# Patient Record
Sex: Female | Born: 1995 | Hispanic: Yes | Smoking: Never smoker
Health system: Southern US, Community
[De-identification: ages and names within clinical notes are randomized; demographics above are authoritative.]

## PROBLEM LIST (undated history)

## (undated) DIAGNOSIS — E669 Obesity, unspecified: Secondary | ICD-10-CM

## (undated) DIAGNOSIS — J45909 Unspecified asthma, uncomplicated: Secondary | ICD-10-CM

## (undated) DIAGNOSIS — L309 Dermatitis, unspecified: Secondary | ICD-10-CM

## (undated) HISTORY — PX: ADENOIDECTOMY: SUR15

## (undated) HISTORY — DX: Dermatitis, unspecified: L30.9

## (undated) HISTORY — PX: TONSILLECTOMY: SUR1361

## (undated) HISTORY — DX: Unspecified asthma, uncomplicated: J45.909

## (undated) HISTORY — DX: Obesity, unspecified: E66.9

## (undated) HISTORY — PX: SINOSCOPY: SHX187

---
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
## 2018-07-10 ENCOUNTER — Inpatient Hospital Stay (HOSPITAL_COMMUNITY)
Admission: AD | Admit: 2018-07-10 | Discharge: 2018-07-10 | Disposition: A | Discharge status: Home / Self Care | Payer: Self-pay | Attending: Obstetrics and Gynecology | Admitting: Obstetrics and Gynecology

## 2018-07-10 ENCOUNTER — Telehealth: Payer: Self-pay | Admitting: Family Medicine

## 2018-07-10 ENCOUNTER — Other Ambulatory Visit: Payer: Self-pay

## 2018-07-10 DIAGNOSIS — Z3202 Encounter for pregnancy test, result negative: Secondary | ICD-10-CM

## 2018-07-10 DIAGNOSIS — N925 Other specified irregular menstruation: Secondary | ICD-10-CM | POA: Insufficient documentation

## 2018-07-10 LAB — POCT PREGNANCY, URINE: Preg Test, Ur: NEGATIVE

## 2018-07-10 NOTE — MAU Note (Signed)
Pt can't leave sample, gave her a glass of water and asked her to wait in the waiting room until she can.

## 2018-07-10 NOTE — Telephone Encounter (Signed)
Patient called in stating that she needs to make an appointment because she thinks she had a miscarriage. Patient was asked if she is currently bleeding and she stated that she is not but she had a clot. Spoke with Diamond Farley about this concern and she advised patient to go to MAU. Patient advised to visit MAU (address was given). Patient verbalized understanding.

## 2018-07-10 NOTE — MAU Note (Signed)
Pt said she may be having a miscarriage. Said she didn't take a pregnancy test and was 2 days late. Was supposed to start 5/6, but started 5/9. Started heavier than usual.

## 2018-07-10 NOTE — MAU Provider Note (Signed)
First Provider Initiated Contact with Patient 07/10/18 1749      S Ms. Diamond Farley is a 23 y.o.  G0P) female who presents to MAU today with complaint of concern of miscarriage. Reports that her menstrual period started about 1-2 days late and that she passed a large clot at the start of her period with some white tinge to it. She is concerned this is a sign of a SAB. No longer bleeding. Menstrual periods typically regular. No longer on hormonal contraception, previously used the patch and OCPs. Denies abdominal pain, cramping, nausea/vomiting, fevers, vaginal discharge.   O BP 135/76 (BP Location: Right Arm)   Pulse 97   Temp 98.8 F (37.1 C) (Oral)   Resp 16   Ht 4' 11.5" (1.511 m)   Wt 78 kg   LMP 07/07/2018   SpO2 97%   BMI 34.16 kg/m  Physical Exam  Constitutional: She is oriented to person, place, and time. She appears well-developed and well-nourished. No distress.  HENT:  Head: Normocephalic and atraumatic.  Eyes: Conjunctivae and EOM are normal.  Neck: Normal range of motion. Neck supple.  Cardiovascular: Normal rate and regular rhythm.  Respiratory: Effort normal and breath sounds normal.  GI: She exhibits no distension. There is no abdominal tenderness. There is no rebound and no guarding.  Musculoskeletal: Normal range of motion.  Neurological: She is alert and oriented to person, place, and time.  Skin: Skin is warm and dry.  Psychiatric: She has a normal mood and affect. Her behavior is normal.    A Non pregnant female. Upreg negative.  Medical screening exam complete   P Discharge from MAU in stable condition Patient given the option of transfer to [facility] for further evaluation or seek care in outpatient facility of choice List of options for follow-up given  Warning signs for worsening condition that would warrant emergency follow-up discussed Patient may return to MAU as needed for pregnancy related complaints  Arvilla Market,  DO 07/10/2018 5:55 PM

## 2018-08-20 ENCOUNTER — Other Ambulatory Visit: Payer: Self-pay

## 2018-08-20 ENCOUNTER — Encounter: Payer: Medicaid Other | Admitting: Certified Nurse Midwife

## 2018-09-14 ENCOUNTER — Other Ambulatory Visit (HOSPITAL_COMMUNITY)
Admission: RE | Admit: 2018-09-14 | Discharge: 2018-09-14 | Disposition: A | Discharge status: Home / Self Care | Payer: Medicaid Other | Source: Ambulatory Visit | Attending: Medical | Admitting: Medical

## 2018-09-14 ENCOUNTER — Ambulatory Visit (INDEPENDENT_AMBULATORY_CARE_PROVIDER_SITE_OTHER): Payer: Medicaid Other | Admitting: Medical

## 2018-09-14 ENCOUNTER — Other Ambulatory Visit: Payer: Self-pay

## 2018-09-14 ENCOUNTER — Encounter: Payer: Self-pay | Admitting: Medical

## 2018-09-14 VITALS — BP 139/87 | HR 100 | Ht 60.0 in | Wt 168.8 lb

## 2018-09-14 DIAGNOSIS — N760 Acute vaginitis: Secondary | ICD-10-CM

## 2018-09-14 DIAGNOSIS — R7303 Prediabetes: Secondary | ICD-10-CM

## 2018-09-14 DIAGNOSIS — B379 Candidiasis, unspecified: Secondary | ICD-10-CM

## 2018-09-14 DIAGNOSIS — Z01419 Encounter for gynecological examination (general) (routine) without abnormal findings: Secondary | ICD-10-CM | POA: Diagnosis present

## 2018-09-14 DIAGNOSIS — J4520 Mild intermittent asthma, uncomplicated: Secondary | ICD-10-CM

## 2018-09-14 DIAGNOSIS — Z3202 Encounter for pregnancy test, result negative: Secondary | ICD-10-CM

## 2018-09-14 DIAGNOSIS — B49 Unspecified mycosis: Secondary | ICD-10-CM

## 2018-09-14 DIAGNOSIS — R030 Elevated blood-pressure reading, without diagnosis of hypertension: Secondary | ICD-10-CM | POA: Insufficient documentation

## 2018-09-14 DIAGNOSIS — Z0000 Encounter for general adult medical examination without abnormal findings: Secondary | ICD-10-CM

## 2018-09-14 DIAGNOSIS — Z3009 Encounter for other general counseling and advice on contraception: Secondary | ICD-10-CM

## 2018-09-14 DIAGNOSIS — J45909 Unspecified asthma, uncomplicated: Secondary | ICD-10-CM | POA: Insufficient documentation

## 2018-09-14 DIAGNOSIS — B9689 Other specified bacterial agents as the cause of diseases classified elsewhere: Secondary | ICD-10-CM

## 2018-09-14 LAB — POCT URINE PREGNANCY: Preg Test, Ur: NEGATIVE

## 2018-09-14 MED ORDER — NORELGESTROMIN-ETH ESTRADIOL 150-35 MCG/24HR TD PTWK: 1.0000 | MEDICATED_PATCH | TRANSDERMAL | 12 refills | Status: DC

## 2018-09-14 NOTE — Progress Notes (Signed)
Subjective:    Diamond Farley is a 23 y.o. female who presents for an annual exam. The patient would also like to discuss birth control options. The patient is sexually active. GYN screening history: no prior history of gyn screening tests. The patient wears seatbelts: yes. The patient participates in regular exercise: no. Has the patient ever been transfused or tattooed?: no. The patient reports that there is not domestic violence in her life.   Menstrual History: OB History    Gravida  0   Para  0   Term  0   Preterm  0   AB  0   Living  0     SAB  0   TAB  0   Ectopic  0   Multiple  0   Live Births  0           Menarche age: unsure, between 54-32 years old  Patient's last menstrual period was 09/08/2018.    The following portions of the patient's history were reviewed and updated as appropriate: allergies, current medications, past family history, past medical history, past social history, past surgical history and problem list.  Review of Systems Pertinent items are noted in HPI.    Objective:   Physical Exam  Nursing note and vitals reviewed. Constitutional: She is oriented to person, place, and time. She appears well-developed and well-nourished. No distress.  HENT:  Head: Normocephalic and atraumatic.  Eyes: EOM are normal.  Neck: Normal range of motion. Neck supple. No thyromegaly present.  Cardiovascular: Normal rate, regular rhythm and normal heart sounds.  No murmur heard. Respiratory: Effort normal and breath sounds normal. No respiratory distress. She has no wheezes.  GI: Soft. Bowel sounds are normal. She exhibits no distension and no mass. There is no abdominal tenderness. There is no rebound and no guarding.  Genitourinary: Uterus is not enlarged and not tender. Cervix exhibits no motion tenderness, no discharge and no friability. Right adnexum displays no mass and no tenderness. Left adnexum displays no mass and no tenderness.    No vaginal  discharge or bleeding.  No bleeding in the vagina.  Musculoskeletal:        General: No edema.  Neurological: She is alert and oriented to person, place, and time.  Skin: Skin is warm and dry. No erythema.  Psychiatric: She has a normal mood and affect.      Results for orders placed or performed in visit on 09/14/18 (from the past 24 hour(s))  POCT urine pregnancy     Status: Normal   Collection Time: 09/14/18  8:49 AM  Result Value Ref Range   Preg Test, Ur Negative Negative    Assessment:    Healthy female exam.   Birth control counseling    Plan:     Await pap smear results.   Patient is requesting STD testing today. Patient has [facility] and understands that full STD testing may result in a bill to the patient.   Patient will be contacted with any abnormal results  Patient encouraged to sign up for MyChart following today's visit in order to receive notification of all results  Rx Ortho Evra patch sent to patient's pharmacy of choice Patient to return to [facility] in 1 year for annual exam or sooner PRN   Danielle Rankin 09/14/2018 8:36 AM

## 2018-09-14 NOTE — Patient Instructions (Signed)

## 2018-09-14 NOTE — Progress Notes (Signed)
Pt presents for New Patient AEX. Pt would like to discuss birth control options today. 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 Pt has no other concerns.

## 2018-09-15 LAB — HIV ANTIBODY (ROUTINE TESTING W REFLEX): HIV Screen 4th Generation wRfx: NONREACTIVE

## 2018-09-15 LAB — HEPATITIS C ANTIBODY: Hep C Virus Ab: <0.1 s/co ratio (ref 0.0–0.9)

## 2018-09-15 LAB — HEPATITIS B SURFACE ANTIGEN: Hepatitis B Surface Ag: NEGATIVE

## 2018-09-15 LAB — RPR: RPR Ser Ql: NONREACTIVE

## 2018-09-17 LAB — CYTOLOGY - PAP
Adequacy: ABSENT
Diagnosis: NEGATIVE

## 2018-09-17 LAB — CERVICOVAGINAL ANCILLARY ONLY
Bacterial vaginitis: POSITIVE — AB
Candida vaginitis: POSITIVE — AB
Chlamydia: NEGATIVE
Neisseria Gonorrhea: NEGATIVE
Trichomonas: NEGATIVE

## 2018-09-18 MED ORDER — METRONIDAZOLE 0.75 % VA GEL: 1.0000 | Freq: Two times a day (BID) | VAGINAL | 0 refills | Status: DC

## 2018-09-18 MED ORDER — FLUCONAZOLE 150 MG PO TABS: 150.0000 mg | ORAL_TABLET | Freq: Every day | ORAL | 0 refills | Status: DC

## 2018-09-18 NOTE — Addendum Note (Signed)
Addended by: Luvenia Redden on: 09/18/2018 08:28 AM   Modules accepted: Orders

## 2018-10-31 ENCOUNTER — Other Ambulatory Visit: Payer: Self-pay

## 2018-10-31 ENCOUNTER — Telehealth: Payer: Self-pay

## 2018-10-31 ENCOUNTER — Other Ambulatory Visit: Payer: Self-pay | Admitting: Medical

## 2018-10-31 DIAGNOSIS — Z3009 Encounter for other general counseling and advice on contraception: Secondary | ICD-10-CM

## 2018-10-31 MED ORDER — NORGESTIMATE-ETH ESTRADIOL 0.25-35 MG-MCG PO TABS: 1.0000 | ORAL_TABLET | Freq: Every day | ORAL | 11 refills | Status: DC

## 2018-10-31 NOTE — Telephone Encounter (Signed)
For some reason the pharmacy keep on  disappearing when I click on it. [facility] is the one on file. Just click select pharmacy.

## 2018-10-31 NOTE — Telephone Encounter (Signed)
I will send Rx for Sprintec but there is no pharmacy in the chart.

## 2018-10-31 NOTE — Telephone Encounter (Signed)
Patient reports the birth control patch ortho evra is making her have an itchy rash. She has move the path into the places on her body however since has not help. She would like to know if [facility]

## 2018-10-31 NOTE — Telephone Encounter (Signed)
She is already taking Zyrtec with no relief. She would like to go back to birth control pills. Pharmacy is updated.

## 2018-10-31 NOTE — Telephone Encounter (Signed)
[facility]. I sent Rx for OCPs to that pharmacy.

## 2018-10-31 NOTE — Telephone Encounter (Signed)
There isn't another type of patch. She may be allergic to the adhesive anyway, so no patches would work for her. She can either try Zyrtec during the day and Benadryl at night for a few days and see if her body gets used to it or she will have to choose another form of birth control. Just let me know what she decides.

## 2019-03-15 ENCOUNTER — Other Ambulatory Visit: Payer: Self-pay

## 2019-03-15 ENCOUNTER — Ambulatory Visit (HOSPITAL_COMMUNITY)
Admission: EM | Admit: 2019-03-15 | Discharge: 2019-03-15 | Disposition: A | Discharge status: Home / Self Care | Payer: Medicaid Other | Attending: Emergency Medicine | Admitting: Emergency Medicine

## 2019-03-15 ENCOUNTER — Encounter (HOSPITAL_COMMUNITY): Payer: Self-pay | Admitting: Emergency Medicine

## 2019-03-15 DIAGNOSIS — J45909 Unspecified asthma, uncomplicated: Secondary | ICD-10-CM | POA: Insufficient documentation

## 2019-03-15 DIAGNOSIS — R030 Elevated blood-pressure reading, without diagnosis of hypertension: Secondary | ICD-10-CM | POA: Insufficient documentation

## 2019-03-15 DIAGNOSIS — N939 Abnormal uterine and vaginal bleeding, unspecified: Secondary | ICD-10-CM

## 2019-03-15 DIAGNOSIS — R7303 Prediabetes: Secondary | ICD-10-CM | POA: Diagnosis not present

## 2019-03-15 DIAGNOSIS — Z79899 Other long term (current) drug therapy: Secondary | ICD-10-CM | POA: Insufficient documentation

## 2019-03-15 LAB — POCT PREGNANCY, URINE: Preg Test, Ur: NEGATIVE

## 2019-03-15 LAB — POC URINE PREG, ED: Preg Test, Ur: NEGATIVE

## 2019-03-15 LAB — POCT URINALYSIS DIP (DEVICE)
Bilirubin Urine: NEGATIVE
Glucose, UA: NEGATIVE mg/dL
Ketones, ur: NEGATIVE mg/dL
Leukocytes,Ua: NEGATIVE
Nitrite: NEGATIVE
Protein, ur: NEGATIVE mg/dL
Specific Gravity, Urine: 1.02 (ref 1.005–1.030)
Urobilinogen, UA: 0.2 mg/dL (ref 0.0–1.0)
pH: 8 (ref 5.0–8.0)

## 2019-03-15 MED ORDER — NAPROXEN 500 MG PO TABS: 500.0000 mg | ORAL_TABLET | Freq: Two times a day (BID) | ORAL | 0 refills | Status: DC

## 2019-03-15 NOTE — ED Triage Notes (Signed)
Pt states she had her period on Dec 26th, and she started having vaginal bleeding again on the 9th up until today. C/o cramping.

## 2019-03-15 NOTE — ED Provider Notes (Signed)
[name]    CSN: [id number] Arrival date & time: 03/15/19  1553      History   Chief Complaint Chief Complaint  Patient presents with  . Vaginal Bleeding    HPI Diamond Farley is a 24 y.o. female.   Diamond Farley presents with complaints of vaginal bleeding which started 6 days ago, Farley though her LMP was 12/26. She typically has one monthly regular period. Her last period was normal for her. Flow has varied, decreased last evening but then today restarted. Not heavy, hasn't had to change out pad today (has only for sanitary purposes but not due to filling.) some cramping. No back pain. No urinary symptoms. Denies  Any previous similar. She is on oral birth control and takes regular, denies any missed pills. Sexually active with 1 partner and doesn't always use condoms. No vaginal discharge itching or irritation. Has appointment with gynecology next month. Denies any specific activity changes or lifestyle changes but has been working early shifts regularly at work recently. History  Of ovarian cysts.    ROS per HPI, negative if not otherwise mentioned.      Past Medical History:  Diagnosis Date  . Asthma     Patient Active Problem List   Diagnosis Date Noted  . Asthma 09/14/2018  . Borderline hypertension 09/14/2018  . Pre-diabetes 09/14/2018    Past Surgical History:  Procedure Laterality Date  . TONSILLECTOMY      OB History    Gravida  0   Para  0   Term  0   Preterm  0   AB  0   Living  0     SAB  0   TAB  0   Ectopic  0   Multiple  0   Live Births  0            Home Medications    Prior to Admission medications   Medication Sig Start Date End Date Taking? Authorizing Provider  albuterol (PROVENTIL HFA;VENTOLIN HFA) 108 (90 Base) MCG/ACT inhaler Inhale 2 puffs into the lungs every 6 (six) hours as needed for wheezing or shortness of breath.    [provider]  beclomethasone (QVAR) 80 MCG/ACT inhaler  Inhale 2 puffs into the lungs 2 (two) times daily.    [provider]  fluconazole (DIFLUCAN) 150 MG tablet Take 1 tablet (150 mg total) by mouth daily. 09/18/18   Luvenia Redden, PA-C  fluticasone (FLONASE) 50 MCG/ACT nasal spray Place 2 sprays into both nostrils daily.    [provider]  metroNIDAZOLE (METROGEL VAGINAL) 0.75 % vaginal gel Place 1 Applicatorful vaginally 2 (two) times daily. 09/18/18   Luvenia Redden, PA-C  naproxen (NAPROSYN) 500 MG tablet Take 1 tablet (500 mg total) by mouth 2 (two) times daily. 03/15/19   Zigmund Gottron, NP  norelgestromin-ethinyl estradiol (ORTHO EVRA) 150-35 MCG/24HR transdermal patch Place 1 patch onto the skin once a week. 09/14/18   Luvenia Redden, PA-C  norgestimate-ethinyl estradiol (ORTHO-CYCLEN) 0.25-35 MG-MCG tablet Take 1 tablet by mouth daily. 10/31/18   Luvenia Redden, PA-C    Family History Family History  Problem Relation Age of Onset  . Diabetes Mother   . Diabetes Maternal Grandmother   . Hypertension Maternal Grandfather   . Diabetes Paternal Grandmother     Social History Social History   Tobacco Use  . Smoking status: Never Smoker  . Smokeless tobacco: Never Used  Substance Use Topics  . Alcohol  use: Not Currently    Alcohol/week: 6.0 standard drinks    Types: 3 Cans of beer, 3 Shots of liquor per week  . Drug use: Yes    Types: Marijuana    Comment: occ     Allergies   Patient has no known allergies.   Review of Systems Review of Systems   Physical Exam Triage Vital Signs ED Triage Vitals  Enc Vitals Group     BP 03/15/19 1641 131/74     Pulse Rate 03/15/19 1641 72     Resp 03/15/19 1641 16     Temp 03/15/19 1641 98.8 F (37.1 C)     Temp src --      SpO2 03/15/19 1641 100 %     Weight --      Height --      Head Circumference --      Peak Flow --      Pain Score 03/15/19 1642 2     Pain Loc --      Pain Edu? --      Excl. in GC? --    No data found.  Updated Vital Signs BP  131/74   Pulse 72   Temp 98.8 F (37.1 C)   Resp 16   LMP 02/23/2019   SpO2 100%   Visual Acuity Right Eye Distance:   Left Eye Distance:   Bilateral Distance:    Right Eye Near:   Left Eye Near:    Bilateral Near:     Physical Exam Constitutional:      General: She is not in acute distress.    Appearance: She is well-developed.  Cardiovascular:     Rate and Rhythm: Normal rate.  Pulmonary:     Effort: Pulmonary effort is normal.  Genitourinary:    Cervix: Cervical bleeding present.     Uterus: Normal.      Adnexa: Right adnexa normal.       Left: Tenderness present. No mass.       Comments: Small amount of vaginal bleeding noted, mild tenderness to left adnexa without palpable abnormality.  Skin:    General: Skin is warm and dry.  Neurological:     Mental Status: She is alert and oriented to person, place, and time.      UC Treatments / Results  Labs (all labs ordered are listed, but only abnormal results are displayed) Labs Reviewed  POCT URINALYSIS DIP (DEVICE) - Abnormal; Notable for the following components:      Result Value   Hgb urine dipstick LARGE (*)    All other components within normal limits  POC URINE PREG, ED  POCT PREGNANCY, URINE  CERVICOVAGINAL ANCILLARY ONLY    EKG   Radiology No results found.  Procedures Procedures (including critical care time)  Medications Ordered in UC Medications - No data to display  Initial Impression / Assessment and Plan / UC Course  I have reviewed the triage vital signs and the nursing notes.  Pertinent labs & imaging results that were available during my care of the patient were reviewed by me and considered in my medical decision making (see chart for details).     Negative pregnancy here today, vitals stable, has been regularly taking oral birth control, lower suspician for ectopic. Mild left adnexal tenderness on exam, however. Naproxen recommended with strict return precautions. Continue with  follow up with gyne for persistent symptoms. Patient verbalized understanding and agreeable to plan.   Final Clinical Impressions(s) / UC  Diagnoses   Final diagnoses:  Abnormal uterine bleeding     Discharge Instructions     Continue taking your birth control.  May take naproxen, twice a day, take with food. May stop taking if bleeding subsides.  Will notify you of any positive findings from your vaginal and if any changes to treatment are needed.   Please continue to follow up with your gynecologist if symptoms persist.  If any worsening of symptoms please return or go to the ER- heavy bleeding, increased pain, weakness, dizziness, lightheadedness.    ED Prescriptions    Medication Sig Dispense Auth. Provider   naproxen (NAPROSYN) 500 MG tablet Take 1 tablet (500 mg total) by mouth 2 (two) times daily. 30 tablet Georgetta Haber, NP     PDMP not reviewed this encounter.   Georgetta Haber, NP 03/15/19 [phone]

## 2019-03-15 NOTE — Discharge Instructions (Addendum)
Continue taking your birth control.  May take naproxen, twice a day, take with food. May stop taking if bleeding subsides.  Will notify you of any positive findings from your vaginal and if any changes to treatment are needed.   Please continue to follow up with your gynecologist if symptoms persist.  If any worsening of symptoms please return or go to the ER- heavy bleeding, increased pain, weakness, dizziness, lightheadedness.

## 2019-03-20 LAB — CERVICOVAGINAL ANCILLARY ONLY
Bacterial vaginitis: POSITIVE — AB
Candida vaginitis: POSITIVE — AB
Chlamydia: POSITIVE — AB
Neisseria Gonorrhea: NEGATIVE
Trichomonas: NEGATIVE

## 2019-03-21 ENCOUNTER — Telehealth (HOSPITAL_COMMUNITY): Payer: Self-pay | Admitting: Emergency Medicine

## 2019-03-21 MED ORDER — METRONIDAZOLE 500 MG PO TABS: 500.0000 mg | ORAL_TABLET | Freq: Two times a day (BID) | ORAL | 0 refills | Status: AC

## 2019-03-21 MED ORDER — FLUCONAZOLE 150 MG PO TABS: 150.0000 mg | ORAL_TABLET | Freq: Once | ORAL | 0 refills | Status: AC

## 2019-03-21 MED ORDER — AZITHROMYCIN 250 MG PO TABS: 1000.0000 mg | ORAL_TABLET | Freq: Once | ORAL | 0 refills | Status: AC

## 2019-03-21 NOTE — Telephone Encounter (Signed)
Bacterial vaginosis is positive. Pt needs treatment. Flagyl 500 mg BID x 7 days #14 no refills sent to patients pharmacy of choice.    Test for candida (yeast) was positive.  Prescription for fluconazole 150mg  po now, repeat dose in 3d if needed, #2 no refills, sent to the pharmacy of record.  Recheck or followup with PCP for further evaluation if symptoms are not improving.    Chlamydia is positive.  Rx po zithromax 1g #1 dose no refills was sent to the pharmacy of record.  Please refrain from sexual intercourse for 7 days to give the medicine time to work, sexual partners need to be notified and tested/treated.  Condoms may reduce risk of reinfection.  Recheck or followup with PCP for further evaluation if symptoms are not improving.   [facility] notified.  Patient contacted by phone and made aware of    results. Pt verbalized understanding and had all questions answered.

## 2019-04-15 ENCOUNTER — Other Ambulatory Visit: Payer: Self-pay

## 2019-04-16 ENCOUNTER — Ambulatory Visit: Payer: 59 | Admitting: Obstetrics and Gynecology

## 2019-05-13 ENCOUNTER — Encounter: Payer: Self-pay | Admitting: Certified Nurse Midwife

## 2019-05-15 ENCOUNTER — Encounter: Payer: Self-pay | Admitting: Certified Nurse Midwife

## 2019-05-22 ENCOUNTER — Ambulatory Visit: Payer: Medicaid Other | Admitting: Obstetrics and Gynecology

## 2019-08-12 ENCOUNTER — Other Ambulatory Visit: Payer: Self-pay

## 2019-08-12 DIAGNOSIS — Z3009 Encounter for other general counseling and advice on contraception: Secondary | ICD-10-CM

## 2019-08-12 MED ORDER — NORGESTIMATE-ETH ESTRADIOL 0.25-35 MG-MCG PO TABS: 1.0000 | ORAL_TABLET | Freq: Every day | ORAL | 0 refills | Status: DC

## 2019-08-12 MED ORDER — NORELGESTROMIN-ETH ESTRADIOL 150-35 MCG/24HR TD PTWK: 1.0000 | MEDICATED_PATCH | TRANSDERMAL | 0 refills | Status: DC

## 2019-08-12 NOTE — Progress Notes (Signed)
1 Rx refill for [facility] sent to pts pharmacy per request, pt has appt for annual exam next month.

## 2019-09-03 ENCOUNTER — Other Ambulatory Visit: Payer: Self-pay | Admitting: Obstetrics

## 2019-09-03 DIAGNOSIS — Z3009 Encounter for other general counseling and advice on contraception: Secondary | ICD-10-CM

## 2019-09-16 ENCOUNTER — Other Ambulatory Visit (HOSPITAL_COMMUNITY)
Admission: RE | Admit: 2019-09-16 | Discharge: 2019-09-16 | Disposition: A | Discharge status: Home / Self Care | Payer: Medicaid Other | Source: Ambulatory Visit | Attending: Advanced Practice Midwife | Admitting: Advanced Practice Midwife

## 2019-09-16 ENCOUNTER — Other Ambulatory Visit: Payer: Self-pay

## 2019-09-16 ENCOUNTER — Ambulatory Visit (INDEPENDENT_AMBULATORY_CARE_PROVIDER_SITE_OTHER): Payer: Medicaid Other | Admitting: Advanced Practice Midwife

## 2019-09-16 ENCOUNTER — Encounter: Payer: Self-pay | Admitting: Advanced Practice Midwife

## 2019-09-16 VITALS — BP 122/81 | HR 93 | Ht 60.0 in | Wt 187.0 lb

## 2019-09-16 DIAGNOSIS — Q5128 Other doubling of uterus, other specified: Secondary | ICD-10-CM

## 2019-09-16 DIAGNOSIS — Z113 Encounter for screening for infections with a predominantly sexual mode of transmission: Secondary | ICD-10-CM | POA: Diagnosis not present

## 2019-09-16 DIAGNOSIS — Z3041 Encounter for surveillance of contraceptive pills: Secondary | ICD-10-CM | POA: Diagnosis not present

## 2019-09-16 DIAGNOSIS — Z01419 Encounter for gynecological examination (general) (routine) without abnormal findings: Secondary | ICD-10-CM

## 2019-09-16 DIAGNOSIS — Z3009 Encounter for other general counseling and advice on contraception: Secondary | ICD-10-CM

## 2019-09-16 DIAGNOSIS — N939 Abnormal uterine and vaginal bleeding, unspecified: Secondary | ICD-10-CM

## 2019-09-16 MED ORDER — NORGESTIMATE-ETH ESTRADIOL 0.25-35 MG-MCG PO TABS: 1.0000 | ORAL_TABLET | Freq: Every day | ORAL | 11 refills | Status: DC

## 2019-09-16 NOTE — Progress Notes (Signed)
Pt states that she is having lighter, more spotty cycles.  Pt states this is a recent change this year.   Pt would like std testing today with blood work.   Last pap - 09/14/2018 - Normal

## 2019-09-16 NOTE — Progress Notes (Signed)
Subjective:     Diamond Farley is a 24 y.o. female here at [facility] for a routine exam.  Current complaints: lighter periods in last few months, spotting only.     Gynecologic History Patient's last menstrual period was 09/08/2019. Contraception: OCP (estrogen/progesterone) Last Pap: 2020. Results were: normal Last mammogram: n/a.   Obstetric History OB History  Gravida Para Term Preterm AB Living  0 0 0 0 0 0  SAB TAB Ectopic Multiple Live Births  0 0 0 0 0     The following portions of the patient's history were reviewed and updated as appropriate: allergies, current medications, past family history, past medical history, past social history, past surgical history and problem list.  Review of Systems Pertinent items noted in HPI and remainder of comprehensive ROS otherwise negative.    Objective:   BP 122/81    Pulse 93    Ht 5' (1.524 m)    Wt 187 lb (84.8 kg)    LMP 09/08/2019    BMI 36.52 kg/m   VS reviewed, nursing note reviewed,  Constitutional: well developed, well nourished, no distress HEENT: normocephalic CV: normal rate Pulm/chest wall: normal effort Breast Exam:  Deferred with shared decision making Abdomen: soft Neuro: alert and oriented x 3 Skin: warm, dry Psych: affect normal Pelvic exam: Cervix pink, visually closed, without lesion, scant white creamy discharge, vaginal walls and external genitalia normal Bimanual exam: Cervix 0/long/high, firm, anterior, neg CMT, uterus nontender, nonenlarged, adnexa without tenderness, enlargement, or mass     Assessment/Plan:   1. Screen for STD (sexually transmitted disease)  - Cervicovaginal ancillary only( [place]) - Hepatitis B surface antigen - HIV Antibody (routine testing w rflx) - RPR - Hepatitis C antibody  2. Well woman exam with routine gynecological exam --Doing well, has change in bleeding pattern this year but no other problems. --Pap wnl 09/14/18.   3. Abnormal uterine bleeding  (AUB) --Likely related to switch in contraception this year. Pt started with Ortho Evra patch which caused skin reaction so switched to Sprintec a few months before bleeding pattern changed.   --Exam today wnl. Reassurance provided to pt. --Pt desires to continue Sprintec as prescribed.  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  Given this and current concerns about abnormal bleeding, will do outpatient [place] with follow up results.  - US PELVIC COMPLETE WITH TRANSVAGINAL; Future  5. Encounter for counseling regarding contraception Discussed LARCs as most effective forms of birth control.  Discussed benefits/risks of other methods.  Pt desires to continue Sprintec.  Discussed failure rates of OCPs with regular use. Reviewed risk factors and s/sx of DVT/PE/reasons to seek medical care.  Rx for Sprintec sent to pharmacy.  - norgestimate-ethinyl estradiol (ORTHO-CYCLEN) 0.25-35 MG-MCG tablet; Take 1 tablet by mouth daily.  Dispense: 28 tablet; Refill: 11   Follow up in: 1 month for [place] follow up then in 1 year for annual exam or as needed.   Sharen Counter, CNM 9:54 AM

## 2019-09-16 NOTE — Patient Instructions (Signed)
Abnormal Uterine Bleeding °Abnormal uterine bleeding is unusual bleeding from the uterus. It includes: °· Bleeding or spotting between periods. °· Bleeding after sex. °· Bleeding that is heavier than normal. °· Periods that last longer than usual. °· Bleeding after menopause. °Abnormal uterine bleeding can affect women at various stages in life, including teenagers, women in their reproductive years, pregnant women, and women who have reached menopause. Common causes of abnormal uterine bleeding include: °· Pregnancy. °· Growths of tissue (polyps). °· A noncancerous tumor in the uterus (fibroid). °· Infection. °· Cancer. °· Hormonal imbalances. °Any type of abnormal bleeding should be evaluated by a health care provider. Many cases are minor and simple to treat, while others are more serious. Treatment will depend on the cause of the bleeding. °Follow these instructions at home: °· Monitor your condition for any changes. °· Do not use tampons, douche, or have sex if told by your health care provider. °· Change your pads often. °· Get regular exams that include pelvic exams and cervical cancer screening. °· Keep all follow-up visits as told by your health care provider. This is important. °Contact a health care provider if: °· Your bleeding lasts for more than one week. °· You feel dizzy at times. °· You feel nauseous or you vomit. °Get help right away if: °· You pass out. °· Your bleeding soaks through a pad every hour. °· You have abdominal pain. °· You have a fever. °· You become sweaty or weak. °· You pass large blood clots from your vagina. °Summary °· Abnormal uterine bleeding is unusual bleeding from the uterus. °· Any type of abnormal bleeding should be evaluated by a health care provider. Many cases are minor and simple to treat, while others are more serious. °· Treatment will depend on the cause of the bleeding. °This information is not intended to replace advice given to you by your health care provider.  Make sure you discuss any questions you have with your health care provider. °Document Revised: 05/24/2017 Document Reviewed: 03/18/2016 °Elsevier Patient Education © 2020 [employer] ° °

## 2019-09-17 LAB — CERVICOVAGINAL ANCILLARY ONLY
Chlamydia: POSITIVE — AB
Comment: NEGATIVE
Comment: NEGATIVE
Comment: NORMAL
Neisseria Gonorrhea: NEGATIVE
Trichomonas: NEGATIVE

## 2019-09-17 LAB — HIV ANTIBODY (ROUTINE TESTING W REFLEX): HIV Screen 4th Generation wRfx: NONREACTIVE

## 2019-09-17 LAB — RPR: RPR Ser Ql: NONREACTIVE

## 2019-09-17 LAB — HEPATITIS B SURFACE ANTIGEN: Hepatitis B Surface Ag: NEGATIVE

## 2019-09-17 LAB — HEPATITIS C ANTIBODY: Hep C Virus Ab: <0.1 s/co ratio (ref 0.0–0.9)

## 2019-09-19 ENCOUNTER — Other Ambulatory Visit: Payer: Self-pay

## 2019-09-19 MED ORDER — DOXYCYCLINE HYCLATE 100 MG PO CAPS: 100.0000 mg | ORAL_CAPSULE | Freq: Two times a day (BID) | ORAL | 0 refills | Status: DC

## 2019-09-19 NOTE — Progress Notes (Signed)
Called pt to notify her of lab results, no answer. LVM for pt to call back. Rx for Chlamydia sent to pt's pharmacy on file. Health Dept notification faxed.

## 2019-09-25 ENCOUNTER — Telehealth: Payer: Self-pay | Admitting: Advanced Practice Midwife

## 2019-09-25 DIAGNOSIS — A749 Chlamydial infection, unspecified: Secondary | ICD-10-CM

## 2019-09-25 MED ORDER — AZITHROMYCIN 500 MG PO TABS: 1000.0000 mg | ORAL_TABLET | Freq: Once | ORAL | 1 refills | Status: AC

## 2019-09-25 NOTE — Telephone Encounter (Signed)
Called pt to discuss positive chlamydia on vaginal swab 09/16/19.  Pt answered and results reviewed with her.  Rx written.  Recommend partner treatment. Refill x 1 written.

## 2019-09-27 NOTE — Progress Notes (Unsigned)
Pt not seen and has seen another practice KW

## 2019-12-30 DIAGNOSIS — U071 COVID-19: Secondary | ICD-10-CM

## 2019-12-30 HISTORY — DX: COVID-19: U07.1

## 2020-01-17 ENCOUNTER — Other Ambulatory Visit: Payer: Medicaid Other

## 2020-01-17 DIAGNOSIS — Z20822 Contact with and (suspected) exposure to covid-19: Secondary | ICD-10-CM

## 2020-01-18 LAB — SARS-COV-2, NAA 2 DAY TAT

## 2020-01-18 LAB — NOVEL CORONAVIRUS, NAA: SARS-CoV-2, NAA: DETECTED — AB

## 2020-01-19 ENCOUNTER — Encounter: Payer: Self-pay | Admitting: Nurse Practitioner

## 2020-01-19 ENCOUNTER — Telehealth: Payer: Self-pay | Admitting: Nurse Practitioner

## 2020-01-19 DIAGNOSIS — E669 Obesity, unspecified: Secondary | ICD-10-CM | POA: Insufficient documentation

## 2020-01-19 NOTE — Telephone Encounter (Signed)
I called Diamond Farley to discuss Covid symptoms and the use of Sotrovimab, a monoclonal antibody infusion for those with mild to moderate Covid symptoms and at a high risk of hospitalization.    Pt is qualified for this infusion at the monoclonal antibody infusion center due to co-morbid conditions and/or a member of an at-risk group, however would like to think more about the infusion at this time. Symptoms tier reviewed as well as criteria for ending isolation.  Symptoms reviewed that would warrant ED/Hospital evaluation. Preventative practices reviewed. Patient verbalized understanding. Patient advised to call back if he decides that he does want to get infusion. Callback number to the infusion center given. Patient advised to go to Urgent care or ED with severe symptoms. Last date pt would be eligible for infusion is 11/26.    Patient Active Problem List   Diagnosis Date Noted  . Obesity   . COVID-19 virus infection 12/2019  . Asthma 09/14/2018  . Borderline hypertension 09/14/2018  . Pre-diabetes 09/14/2018    Nicolasa Ducking, NP

## 2020-02-04 ENCOUNTER — Other Ambulatory Visit: Payer: Self-pay | Admitting: Physician Assistant

## 2020-02-04 ENCOUNTER — Ambulatory Visit
Admission: RE | Admit: 2020-02-04 | Discharge: 2020-02-04 | Disposition: A | Discharge status: Home / Self Care | Payer: Medicaid Other | Source: Ambulatory Visit | Attending: Physician Assistant | Admitting: Physician Assistant

## 2020-02-04 DIAGNOSIS — Z8709 Personal history of other diseases of the respiratory system: Secondary | ICD-10-CM

## 2020-02-04 DIAGNOSIS — R0602 Shortness of breath: Secondary | ICD-10-CM

## 2020-02-04 DIAGNOSIS — U071 COVID-19: Secondary | ICD-10-CM

## 2020-02-10 ENCOUNTER — Telehealth: Payer: Self-pay

## 2020-02-10 NOTE — Telephone Encounter (Signed)
Returned call, no answer, left vm 

## 2020-02-10 NOTE — Telephone Encounter (Signed)
error 

## 2020-02-11 ENCOUNTER — Ambulatory Visit: Payer: Medicaid Other

## 2020-02-17 ENCOUNTER — Other Ambulatory Visit: Payer: Self-pay

## 2020-02-17 ENCOUNTER — Other Ambulatory Visit (HOSPITAL_COMMUNITY)
Admission: RE | Admit: 2020-02-17 | Discharge: 2020-02-17 | Disposition: A | Discharge status: Home / Self Care | Payer: Medicaid Other | Source: Ambulatory Visit | Attending: Obstetrics and Gynecology | Admitting: Obstetrics and Gynecology

## 2020-02-17 ENCOUNTER — Ambulatory Visit (INDEPENDENT_AMBULATORY_CARE_PROVIDER_SITE_OTHER): Payer: Self-pay

## 2020-02-17 VITALS — BP 135/87 | HR 58

## 2020-02-17 DIAGNOSIS — N898 Other specified noninflammatory disorders of vagina: Secondary | ICD-10-CM | POA: Insufficient documentation

## 2020-02-17 DIAGNOSIS — Z113 Encounter for screening for infections with a predominantly sexual mode of transmission: Secondary | ICD-10-CM

## 2020-02-17 DIAGNOSIS — R350 Frequency of micturition: Secondary | ICD-10-CM

## 2020-02-17 LAB — POCT URINALYSIS DIPSTICK
Bilirubin, UA: NEGATIVE
Blood, UA: NEGATIVE
Glucose, UA: NEGATIVE
Ketones, UA: NEGATIVE
Leukocytes, UA: NEGATIVE
Nitrite, UA: NEGATIVE
Protein, UA: NEGATIVE
Spec Grav, UA: 1.01 (ref 1.010–1.025)
Urobilinogen, UA: 0.2 E.U./dL
pH, UA: 6.5 (ref 5.0–8.0)

## 2020-02-17 NOTE — Progress Notes (Signed)
Patient was assessed and managed by nursing staff during this encounter. I have reviewed the chart and agree with the documentation and plan. I have also made any necessary editorial changes.  Catalina Antigua, MD 02/17/2020 9:28 AM

## 2020-02-17 NOTE — Progress Notes (Signed)
Patient presents for UTI sx. She states that she is having frequent urination and cramping after intercourse. Sx started about a week ago. She denies having any dysuria, urinary urgency, hematuria.  Urine dip today is negative.  She also denies having any vaginal discharge and vaginal odor. But complains of some vaginal irritation. Offered patient STD testing. Patient agrees.  Patient informed that insurance will only cover GC/CH, HIV, and RPR. Insurance will not cover urine dip, and informed that she may get a bill for this. Patient verbalized understanding.  Self swab completed. Informed patient that there is a 24-48 hour turn around for results, and that treatment will be determined at that time.

## 2020-02-18 LAB — CERVICOVAGINAL ANCILLARY ONLY
Chlamydia: NEGATIVE
Comment: NEGATIVE
Comment: NORMAL
Neisseria Gonorrhea: NEGATIVE

## 2020-02-18 LAB — RPR: RPR Ser Ql: NONREACTIVE

## 2020-02-18 LAB — HIV ANTIBODY (ROUTINE TESTING W REFLEX): HIV Screen 4th Generation wRfx: NONREACTIVE

## 2020-06-03 ENCOUNTER — Ambulatory Visit (INDEPENDENT_AMBULATORY_CARE_PROVIDER_SITE_OTHER): Payer: Managed Care, Other (non HMO)

## 2020-06-03 ENCOUNTER — Other Ambulatory Visit (HOSPITAL_COMMUNITY)
Admission: RE | Admit: 2020-06-03 | Discharge: 2020-06-03 | Disposition: A | Discharge status: Home / Self Care | Payer: Managed Care, Other (non HMO) | Source: Ambulatory Visit | Attending: Obstetrics | Admitting: Obstetrics

## 2020-06-03 ENCOUNTER — Other Ambulatory Visit: Payer: Self-pay

## 2020-06-03 DIAGNOSIS — N898 Other specified noninflammatory disorders of vagina: Secondary | ICD-10-CM | POA: Insufficient documentation

## 2020-06-03 DIAGNOSIS — R309 Painful micturition, unspecified: Secondary | ICD-10-CM

## 2020-06-03 LAB — POCT URINALYSIS DIPSTICK
Bilirubin, UA: NEGATIVE
Glucose, UA: NEGATIVE
Ketones, UA: NEGATIVE
Nitrite, UA: NEGATIVE
Protein, UA: NEGATIVE
Spec Grav, UA: 1.01 (ref 1.010–1.025)
Urobilinogen, UA: 0.2 E.U./dL
pH, UA: 6 (ref 5.0–8.0)

## 2020-06-03 NOTE — Progress Notes (Signed)
Agree with A & P. 

## 2020-06-03 NOTE — Addendum Note (Signed)
Addended by: Natale Milch D on: 06/03/2020 03:43 PM   Modules accepted: Orders

## 2020-06-03 NOTE — Progress Notes (Signed)
Pt is in the office for possible UTI.  Pt reports vaginal burning with urination and vaginal discharge.  Denies pelvic pain. Pt left urine sample and will do self swab today.

## 2020-06-04 ENCOUNTER — Other Ambulatory Visit: Payer: Self-pay

## 2020-06-04 LAB — CERVICOVAGINAL ANCILLARY ONLY
Bacterial Vaginitis (gardnerella): POSITIVE — AB
Candida Glabrata: NEGATIVE
Candida Vaginitis: NEGATIVE
Chlamydia: NEGATIVE
Comment: NEGATIVE
Comment: NEGATIVE
Comment: NEGATIVE
Comment: NEGATIVE
Comment: NEGATIVE
Comment: NORMAL
Neisseria Gonorrhea: NEGATIVE
Trichomonas: NEGATIVE

## 2020-06-04 MED ORDER — METRONIDAZOLE 500 MG PO TABS: 500.0000 mg | ORAL_TABLET | Freq: Two times a day (BID) | ORAL | 0 refills | Status: DC

## 2020-06-04 NOTE — Telephone Encounter (Signed)
Patient inform of test results and flagyl has been called into her pharmacy to clear up infection.

## 2020-06-05 LAB — URINE CULTURE

## 2020-06-11 ENCOUNTER — Encounter: Payer: Self-pay | Admitting: Obstetrics and Gynecology

## 2020-06-11 ENCOUNTER — Other Ambulatory Visit: Payer: Self-pay

## 2020-06-11 ENCOUNTER — Other Ambulatory Visit (HOSPITAL_COMMUNITY)
Admission: RE | Admit: 2020-06-11 | Discharge: 2020-06-11 | Disposition: A | Discharge status: Home / Self Care | Payer: Managed Care, Other (non HMO) | Source: Ambulatory Visit | Attending: Obstetrics and Gynecology | Admitting: Obstetrics and Gynecology

## 2020-06-11 ENCOUNTER — Ambulatory Visit (INDEPENDENT_AMBULATORY_CARE_PROVIDER_SITE_OTHER): Payer: Managed Care, Other (non HMO) | Admitting: Obstetrics and Gynecology

## 2020-06-11 VITALS — BP 133/85 | HR 62 | Wt 197.0 lb

## 2020-06-11 DIAGNOSIS — N898 Other specified noninflammatory disorders of vagina: Secondary | ICD-10-CM

## 2020-06-11 DIAGNOSIS — N760 Acute vaginitis: Secondary | ICD-10-CM | POA: Diagnosis not present

## 2020-06-11 DIAGNOSIS — B9689 Other specified bacterial agents as the cause of diseases classified elsewhere: Secondary | ICD-10-CM | POA: Diagnosis not present

## 2020-06-11 DIAGNOSIS — R3915 Urgency of urination: Secondary | ICD-10-CM

## 2020-06-11 NOTE — Progress Notes (Signed)
Pt states she was treated for BV and ?UTI. Pt states she still has trouble urinating, feels like she needs to go but cannot.

## 2020-06-11 NOTE — Progress Notes (Signed)
   GYNECOLOGY OFFICE FOLLOW UP NOTE  History:  25 y.o. G0P0000 here today for follow up for BV. States she is still having the urge to urinate and nothing is coming out. Drinking adequate amounts of water. Wants to make sure BV was treated. Also reports bumps on the vulva that appeared after BV diagnosis that made it hurt to wipe. Denies blisters. Having white discharge that is different and more than her usual    Past Medical History:  Diagnosis Date  . Asthma   . COVID-19 virus infection 12/2019  . Obesity     Past Surgical History:  Procedure Laterality Date  . TONSILLECTOMY       Current Outpatient Medications:  .  albuterol (PROVENTIL HFA;VENTOLIN HFA) 108 (90 Base) MCG/ACT inhaler, Inhale 2 puffs into the lungs every 6 (six) hours as needed for wheezing or shortness of breath., Disp: , Rfl:  .  beclomethasone (QVAR) 80 MCG/ACT inhaler, Inhale 2 puffs into the lungs 2 (two) times daily. (Patient not taking: Reported on 06/03/2020), Disp: , Rfl:  .  fluticasone (FLONASE) 50 MCG/ACT nasal spray, Place 2 sprays into both nostrils daily., Disp: , Rfl:  .  norgestimate-ethinyl estradiol (ORTHO-CYCLEN) 0.25-35 MG-MCG tablet, Take 1 tablet by mouth daily., Disp: 28 tablet, Rfl: 11  The following portions of the patient's history were reviewed and updated as appropriate: allergies, current medications, past family history, past medical history, past social history, past surgical history and problem list.   Review of Systems:  Pertinent items noted in HPI and remainder of comprehensive ROS otherwise negative.   Objective:  Physical Exam BP 133/85   Pulse 62   Wt 197 lb (89.4 kg)   LMP 05/21/2020   BMI 38.47 kg/m  CONSTITUTIONAL: Well-developed, well-nourished female in no acute distress.  HENT:  Normocephalic, atraumatic. External right and left ear normal. Oropharynx is clear and moist EYES: Conjunctivae and EOM are normal. Pupils are equal, round, and reactive to light. No scleral  icterus.  NECK: Normal range of motion, supple, no masses SKIN: Skin is warm and dry. No rash noted. Not diaphoretic. No erythema. No pallor. NEUROLOGIC: Alert and oriented to person, place, and time. Normal reflexes, muscle tone coordination. No cranial nerve deficit noted. PSYCHIATRIC: Normal mood and affect. Normal behavior. Normal judgment and thought content. CARDIOVASCULAR: Normal heart rate noted RESPIRATORY: Effort normal, no problems with respiration noted ABDOMEN: Soft, no distention noted.   PELVIC: Normal appearing external genitalia; normal appearing vaginal mucosa and cervix.  Slightly increased white discharge noted.  pelvic cultures obtained. No abnormal bumps noted. MUSCULOSKELETAL: Normal range of motion. No edema noted.  Exam done with chaperone present.  Labs and Imaging No results found.  Assessment & Plan:  1. Urinary urgency - Urine Culture  1. Urinary urgency - Urine Culture  2. Vaginal lesion Provided reassurance no abnormal vaginal lesions noted  3. Vaginal discharge Check yeast  4. BV (bacterial vaginosis) Treated adequately    Routine preventative health maintenance measures emphasized. Please refer to After Visit Summary for other counseling recommendations.   Return if symptoms worsen or fail to improve.   Baldemar Lenis, MD, [facility] Attending Center for [facility])

## 2020-06-14 LAB — URINE CULTURE

## 2020-06-22 LAB — CERVICOVAGINAL ANCILLARY ONLY
Candida Glabrata: NEGATIVE
Candida Vaginitis: NEGATIVE
Comment: NEGATIVE
Comment: NEGATIVE

## 2020-09-14 ENCOUNTER — Ambulatory Visit (INDEPENDENT_AMBULATORY_CARE_PROVIDER_SITE_OTHER): Payer: Managed Care, Other (non HMO) | Admitting: Obstetrics

## 2020-09-14 ENCOUNTER — Other Ambulatory Visit: Payer: Self-pay

## 2020-09-14 ENCOUNTER — Other Ambulatory Visit (HOSPITAL_COMMUNITY)
Admission: RE | Admit: 2020-09-14 | Discharge: 2020-09-14 | Disposition: A | Discharge status: Home / Self Care | Payer: Managed Care, Other (non HMO) | Source: Ambulatory Visit | Attending: Obstetrics | Admitting: Obstetrics

## 2020-09-14 ENCOUNTER — Encounter: Payer: Self-pay | Admitting: Obstetrics

## 2020-09-14 VITALS — BP 128/86 | HR 78 | Wt 193.0 lb

## 2020-09-14 DIAGNOSIS — N898 Other specified noninflammatory disorders of vagina: Secondary | ICD-10-CM | POA: Insufficient documentation

## 2020-09-14 DIAGNOSIS — N939 Abnormal uterine and vaginal bleeding, unspecified: Secondary | ICD-10-CM | POA: Diagnosis not present

## 2020-09-14 DIAGNOSIS — Z3041 Encounter for surveillance of contraceptive pills: Secondary | ICD-10-CM | POA: Diagnosis not present

## 2020-09-14 DIAGNOSIS — E669 Obesity, unspecified: Secondary | ICD-10-CM

## 2020-09-14 DIAGNOSIS — Z3009 Encounter for other general counseling and advice on contraception: Secondary | ICD-10-CM

## 2020-09-14 DIAGNOSIS — N946 Dysmenorrhea, unspecified: Secondary | ICD-10-CM

## 2020-09-14 MED ORDER — IBUPROFEN 800 MG PO TABS: 800.0000 mg | ORAL_TABLET | Freq: Three times a day (TID) | ORAL | 5 refills | Status: AC | PRN

## 2020-09-14 MED ORDER — NORETHINDRONE 0.35 MG PO TABS: 1.0000 | ORAL_TABLET | Freq: Every day | ORAL | 11 refills | Status: DC

## 2020-09-14 NOTE — Progress Notes (Signed)
RGYN patient presents for problem visit   LMP: 08/09/20 lasted 4 days  Cycle was late onset today and is heavy.   CC: pt states period have been heavy and was late x 1 wk.  Pt uses super tampons has to change after x 1 hr has clots/cramps that is as having stabbing pain. Pt does take IBP with cycles states it depends someday's as far as relief

## 2020-09-14 NOTE — Progress Notes (Signed)
Patient ID: Diamond Farley, female   DOB: October 25, 1995, 25 y.o.   MRN: [id number]  Chief Complaint  Patient presents with   Menorrhagia    HPI Diamond Farley is a 25 y.o. female.  Complains of heavy and painful periods.  Was on OCP's, but stopped. HPI  Past Medical History:  Diagnosis Date   Asthma    COVID-19 virus infection 12/2019   Obesity     Past Surgical History:  Procedure Laterality Date   TONSILLECTOMY      Family History  Problem Relation Age of Onset   Diabetes Mother    Diabetes Maternal Grandmother    Hypertension Maternal Grandfather    Diabetes Paternal Grandmother     Social History Social History   Tobacco Use   Smoking status: Never   Smokeless tobacco: Never  Vaping Use   Vaping Use: Never used  Substance Use Topics   Alcohol use: Not Currently    Alcohol/week: 6.0 standard drinks    Types: 3 Cans of beer, 3 Shots of liquor per week   Drug use: Yes    Types: Marijuana    Comment: occ    No Known Allergies  Current Outpatient Medications  Medication Sig Dispense Refill   albuterol (PROVENTIL HFA;VENTOLIN HFA) 108 (90 Base) MCG/ACT inhaler Inhale 2 puffs into the lungs every 6 (six) hours as needed for wheezing or shortness of breath.     beclomethasone (QVAR) 80 MCG/ACT inhaler Inhale 2 puffs into the lungs 2 (two) times daily.     ibuprofen (ADVIL) 800 MG tablet Take 1 tablet (800 mg total) by mouth every 8 (eight) hours as needed. 30 tablet 5   norethindrone (MICRONOR) 0.35 MG tablet Take 1 tablet (0.35 mg total) by mouth daily. 28 tablet 11   fluticasone (FLONASE) 50 MCG/ACT nasal spray Place 2 sprays into both nostrils daily. (Patient not taking: Reported on 09/14/2020)     No current facility-administered medications for this visit.    Review of Systems Review of Systems Constitutional: negative for fatigue and weight loss Respiratory: negative for cough and wheezing Cardiovascular: negative for chest pain, fatigue and  palpitations Gastrointestinal: negative for abdominal pain and change in bowel habits Genitourinary: positive for heavy and painful periods Integument/breast: negative for nipple discharge Musculoskeletal:negative for myalgias Neurological: negative for gait problems and tremors Behavioral/Psych: negative for abusive relationship, depression Endocrine: negative for temperature intolerance      Blood pressure 128/86, pulse 78, weight 193 lb (87.5 kg).  Physical Exam Physical Exam General:   Alert and no distress  Skin:   no rash or abnormalities  Lungs:   clear to auscultation bilaterally  Heart:   regular rate and rhythm, S1, S2 normal, no murmur, click, rub or gallop  Breasts:   Not examined  Abdomen:  normal findings: no organomegaly, soft, non-tender and no hernia  Pelvis:  External genitalia: normal general appearance Urinary system: urethral meatus normal and bladder without fullness, nontender Vaginal: normal without tenderness, induration or masses Cervix: normal appearance Adnexa: normal bimanual exam Uterus: anteverted and non-tender, normal size   I have spent a total of 20 minutes of face-to-face time, excluding clinical staff time, reviewing notes and preparing to see patient, ordering tests and/or medications, and counseling the patient.   Data Reviewed Labs  Assessment     1. Abnormal uterine bleeding (AUB) Rx: - CBC  2. Vaginal discharge Rx: - Cervicovaginal ancillary only( [place])  3. Encounter for counseling regarding contraception Rx:  4. Encounter for  surveillance of contraceptive pills Rx: - norethindrone (MICRONOR) 0.35 MG tablet; Take 1 tablet (0.35 mg total) by mouth daily.  Dispense: 28 tablet; Refill: 11  5. Dysmenorrhea Rx: - ibuprofen (ADVIL) 800 MG tablet; Take 1 tablet (800 mg total) by mouth every 8 (eight) hours as needed.  Dispense: 30 tablet; Refill: 5  6. Obesity (BMI 35.0-39.9 without comorbidity) - weight loss with the aid  of dietary changes, exercise and behavioral modification recommended     Plan   Follow up in 3 months for OCP surveillance  Orders Placed This Encounter  Procedures   CBC   Meds ordered this encounter  Medications   norethindrone (MICRONOR) 0.35 MG tablet    Sig: Take 1 tablet (0.35 mg total) by mouth daily.    Dispense:  28 tablet    Refill:  11   ibuprofen (ADVIL) 800 MG tablet    Sig: Take 1 tablet (800 mg total) by mouth every 8 (eight) hours as needed.    Dispense:  30 tablet    Refill:  5     Brock Bad, MD 09/14/2020 12:23 PM

## 2020-09-15 LAB — CERVICOVAGINAL ANCILLARY ONLY
Bacterial Vaginitis (gardnerella): POSITIVE — AB
Candida Glabrata: NEGATIVE
Candida Vaginitis: NEGATIVE
Chlamydia: NEGATIVE
Comment: NEGATIVE
Comment: NEGATIVE
Comment: NEGATIVE
Comment: NEGATIVE
Comment: NEGATIVE
Comment: NORMAL
Neisseria Gonorrhea: NEGATIVE
Trichomonas: NEGATIVE

## 2020-09-15 LAB — CBC
Hematocrit: 40.3 % (ref 34.0–46.6)
Hemoglobin: 12.8 g/dL (ref 11.1–15.9)
MCH: 27 pg (ref 26.6–33.0)
MCHC: 31.8 g/dL (ref 31.5–35.7)
MCV: 85 fL (ref 79–97)
Platelets: 268 10*3/uL (ref 150–450)
RBC: 4.74 x10E6/uL (ref 3.77–5.28)
RDW: 13 % (ref 11.7–15.4)
WBC: 10.1 10*3/uL (ref 3.4–10.8)

## 2020-09-21 ENCOUNTER — Other Ambulatory Visit: Payer: Self-pay | Admitting: Obstetrics

## 2020-09-21 DIAGNOSIS — N760 Acute vaginitis: Secondary | ICD-10-CM

## 2020-09-21 MED ORDER — METRONIDAZOLE 500 MG PO TABS: 500.0000 mg | ORAL_TABLET | Freq: Two times a day (BID) | ORAL | 2 refills | Status: DC

## 2020-11-13 ENCOUNTER — Ambulatory Visit
Admission: RE | Admit: 2020-11-13 | Discharge: 2020-11-13 | Disposition: A | Discharge status: Home / Self Care | Payer: Managed Care, Other (non HMO) | Source: Ambulatory Visit | Attending: Internal Medicine | Admitting: Internal Medicine

## 2020-11-13 ENCOUNTER — Other Ambulatory Visit: Payer: Self-pay | Admitting: Internal Medicine

## 2020-11-13 DIAGNOSIS — M79641 Pain in right hand: Secondary | ICD-10-CM

## 2021-01-06 NOTE — Progress Notes (Signed)
NEW PATIENT Date of Service/Encounter:  01/07/21 Referring provider: none-self referred Primary care provider: No primary care provider on file.  Subjective:  Diamond Farley is a 25 y.o. female with a PMHx of asthma and chronic rhinitis presenting today for evaluation of asthma and chronic rhinitis. History obtained from: chart review and patient.   Asthma: Diagnosed in young childhood.  Current symptoms include chest tightness, cough, shortness of breath, and wheezing 2-3 times per week daytime symptoms in past month, 2-3 times per week nighttime awakenings in past month for coughing Hasn't had a rescue for the past 3-4 months Limitations to daily activity: mild 0 ED visits, 0 UC visits and 1 oral steroids in the past year 0 number of lifetime hospitalizations Identified Triggers:  change in weather, respiratory illness, and cold air Prior PFTs or spirometry: younger-results not available for review Previously used therapies: has been on a controller inhaler when she was younger, but this was over 6 years ago.  Current regimen:  Maintenance: none currently Rescue: Albuterol 2 puffs q4-6 hrs PRN, none prior to exercise History of prior pneumonias: 0 History of prior COVID-19 infection: yes-last year 2021-did okay Smoking exposure: around someone who smokes, previous smoking history - quit 4-5 years ago  Chronic rhinitis: started in childhood Symptoms include: rhinorrhea, sneezing, watery eyes, and itchy eyes  Occurs year-round with seasonal flares-Spring and Fall Potential triggers: dust, pollen Treatments tried: OTC AH (cetirizine), has tried flonase in the past, none currently Previous allergy testing: no History of reflux/heartburn:  yes-doesn't take anything for it, this happens 3 to 4 times per week  Other allergy screening: Food allergy: no Medication allergy: no Hymenoptera allergy: no Urticaria: no Eczema:yes-flares on her elbows infrequently, possibly using a  steroid cream (mother got it from [place]) She has not gotten flu or COVID 19 infection.  Past Medical History: Past Medical History:  Diagnosis Date   Asthma    Eczema    Medication List:  Current Outpatient Medications  Medication Sig Dispense Refill   albuterol (VENTOLIN HFA) 108 (90 Base) MCG/ACT inhaler Inhale 2 puffs into the lungs every 6 (six) hours as needed for wheezing or shortness of breath. 8 g 2   cetirizine (ZYRTEC ALLERGY) 10 MG tablet Take 1 tablet (10 mg total) by mouth daily. 30 tablet 5   fluticasone (FLONASE) 50 MCG/ACT nasal spray Place 2 sprays into both nostrils daily. 16 g 5   fluticasone (FLOVENT HFA) 110 MCG/ACT inhaler Inhale 2 puffs into the lungs in the morning and at bedtime. 1 each 3   ibuprofen (ADVIL) 800 MG tablet Take 800 mg by mouth every 8 (eight) hours as needed.     Olopatadine HCl (PATADAY) 0.2 % SOLN Place 1 drop into both eyes 1 day or 1 dose. 2.5 mL 5   omeprazole (PRILOSEC) 40 MG capsule Take 1 capsule (40 mg total) by mouth daily. 30 capsule 5   Spacer/Aero-Holding Chambers DEVI 1 each by Does not apply route as needed. 1 each 1   No current facility-administered medications for this visit.   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   ROS:  All other systems negative except as noted per HPI.  Objective:  Blood pressure 116/78, pulse 87, temperature 98.2 F (36.8 C), temperature source Temporal, resp. rate (!) 32, height 4' 11.5" (1.511  m), weight 188 lb 9.6 oz (85.5 kg), SpO2 99 %. Body mass index is 37.46 kg/m. Physical Exam:  General Appearance:  Alert, cooperative, no distress, appears stated age  Head:  Normocephalic, without obvious abnormality, atraumatic  Eyes:  Conjunctiva clear, EOM's intact  Nose: Nares normal, mildly edematous turbinates, pink mucosa, 2 nasal rings each nostril  Throat: Lips, tongue normal; teeth and gums normal, normal posterior oropharynx  Neck: Supple, symmetrical  Lungs:   CtAB, Respirations unlabored, no coughing  Heart:  RRR, no murmur, Appears well perfused  Extremities: No edema  Skin: Skin color, texture, turgor normal, no rashes or lesions on visualized portions of skin  Neurologic: No gross deficits   Diagnostics: Spirometry:  Tracings reviewed. Her effort: Good reproducible efforts. FVC: 2.75L (pre), 2.78L  (post) FEV1: 2.33L, 82% predicted (pre), 2.36L, 83% predicted (post) FEV1/FVC ratio: 99% (pre), 99% (post) Interpretation:  No overt obstruction  with out bronchodilator response  Skin Testing: Environmental allergy panel. Adequate positive and negative controls. Results discussed with patient/family.  Airborne Adult Perc - 01/07/21 1219     Time Antigen Placed 1219    Allergen Manufacturer Waynette Buttery    Location Back    Number of Test 59    Panel 1 Select    1. Control-Buffer 50% Glycerol Negative    2. Control-Histamine 1 mg/ml 3+    3. Albumin saline Negative    4. Bahia Negative    5. French Southern Territories Negative    6. Johnson Negative    7. Kentucky Blue 3+    8. Meadow Fescue 3+    9. Perennial Rye 3+    10. Sweet Vernal 3+    11. Timothy Negative    12. Cocklebur Negative    13. Burweed Marshelder Negative    14. Ragweed, short Negative    15. Ragweed, Giant Negative    16. Plantain,  English Negative    17. Lamb's Quarters Negative    18. Sheep Sorrell Negative    19. Rough Pigweed Negative    20. Marsh Elder, Rough Negative    21. Mugwort, Common Negative     22. Ash mix Negative    23. Birch mix Negative    24. Beech American Negative    26. Cedar, red Negative    27. Cottonwood, Guinea-Bissau Negative    28. Elm mix 3+    29. Hickory 3+    30. Maple mix 2+    31. Oak, Guinea-Bissau mix Negative    32. Pecan Pollen Negative    33. Pine mix Negative    34. Sycamore Eastern Negative    35. Walnut, Black Pollen Negative    36. Alternaria alternata Negative    37. Cladosporium Herbarum Negative    38. Aspergillus mix Negative    39. Penicillium mix Negative    40. Bipolaris sorokiniana (Helminthosporium) 3+    41. Drechslera spicifera (Curvularia) Negative    42. Mucor plumbeus Negative    43. Fusarium moniliforme Negative    44. Aureobasidium pullulans (pullulara) Negative    45. Rhizopus oryzae Negative    46.  Botrytis cinera Negative    47. Epicoccum nigrum Negative    48. Phoma betae 2+    49. Candida Albicans 2+    50. Trichophyton mentagrophytes Negative    51. Mite, D Farinae  5,000 AU/ml Negative    52. Mite, D Pteronyssinus  5,000 AU/ml Negative    53. Cat Hair 10,000 BAU/ml 4+    54.  Dog Epithelia Negative    55. Mixed Feathers Negative    56. Horse Epithelia Negative    57. Cockroach, German Negative    58. Mouse Negative    59. Tobacco Leaf Negative             Intradermal - 01/07/21 1222     Time Antigen Placed 1223    Allergen Manufacturer Waynette Buttery    Location Arm    Number of Test 11    Intradermal Select    Control Negative    French Southern Territories Negative    Johnson Negative    Ragweed mix Negative    Weed mix Negative    Mold 1 Negative    Mold 2 Negative    Mold 4 3+    Dog Negative    Cockroach Negative    Mite mix 4+             Allergy testing results were read and interpreted by myself, documented by clinical staff.  Assessment and Plan   Patient Instructions  Chronic Rhinitis seasonal and perennial allergic:uncontrolled - allergy testing today was positive to grasses, trees, molds, cat, dust mites -  allergen avoidance as below - consider allergy shots as long term control of your symptoms by teaching your immune system to be more tolerant of your allergy triggers - Start Nasal Steroid Spray: Options include Flonase (fluticasone), Nasocort (triamcinolone), Nasonex (mometasome) 1- 2 sprays in each nostril daily (can buy over-the-counter if not covered by insurance)  Best results if used daily. - Start over the counter antihistamine daily or daily as needed.   -Your options include Zyrtec (Cetirizine) 10mg , Claritin (Loratadine) 10mg , Allegra (Fexofenadine) 180mg , or Xyzal (Levocetirinze) 5mg   Allergic Conjunctivitis: uncontrolled - Start Allergy Eye drops: great options include Pataday (Olopatadine) or Zaditor (ketotifen) for eye symptoms daily as needed-both sold over the counter if not covered by insurance.   -Avoid eye drops that say red eye relief as they may contain medications that dry out your eyes.  Concern for moderate persistent Asthma: - your lung testing today did not show obstruction and did not show reversibility with albuterol which is inconsistent with asthma, however, will give trial of medication to determine clinical response - Start Flovent 110, 2 puffs twice a day with a spacer; THIS SHOULD BE USED EVERY DAY - Rinse mouth out after use - Use Albuterol (Proair/Ventolin) 2 puffs every 4-6 hours as needed for chest tightness, wheezing, or coughing - Use Albuterol (Proair/Ventolin) 2 puffs 15 minutes prior to exercise if you have symptoms with activity - Use a spacer with all inhalers - Asthma is not controlled if:  - Symptoms are occurring >2 times a week OR  - >2 times a month nighttime awakenings  - Please call the clinic to schedule a follow up if these symptoms arise  Reflux: uncontrolled - start omeprazole 40 mg daily-plan for 6 month trial, if controlled would step down to H2 blocker - lifestyle modifications as below  Follow-up in 6 weeks.   Return in about 6  weeks (around 02/18/2021). Please do not hesitate to contact me with  questions.  Sincerely,  Tonny Bollman, MD [facility]

## 2021-01-07 ENCOUNTER — Other Ambulatory Visit: Payer: Self-pay

## 2021-01-07 ENCOUNTER — Ambulatory Visit (INDEPENDENT_AMBULATORY_CARE_PROVIDER_SITE_OTHER): Payer: Managed Care, Other (non HMO) | Admitting: Internal Medicine

## 2021-01-07 ENCOUNTER — Encounter: Payer: Self-pay | Admitting: Internal Medicine

## 2021-01-07 VITALS — BP 116/78 | HR 87 | Temp 98.2°F | Resp 32 | Ht 59.5 in | Wt 188.6 lb

## 2021-01-07 DIAGNOSIS — J3089 Other allergic rhinitis: Secondary | ICD-10-CM | POA: Diagnosis not present

## 2021-01-07 DIAGNOSIS — H1013 Acute atopic conjunctivitis, bilateral: Secondary | ICD-10-CM | POA: Diagnosis not present

## 2021-01-07 DIAGNOSIS — R053 Chronic cough: Secondary | ICD-10-CM | POA: Diagnosis not present

## 2021-01-07 DIAGNOSIS — J310 Chronic rhinitis: Secondary | ICD-10-CM

## 2021-01-07 DIAGNOSIS — J302 Other seasonal allergic rhinitis: Secondary | ICD-10-CM

## 2021-01-07 DIAGNOSIS — K219 Gastro-esophageal reflux disease without esophagitis: Secondary | ICD-10-CM | POA: Insufficient documentation

## 2021-01-07 MED ORDER — SPACER/AERO-HOLDING CHAMBERS DEVI: 1.0000 | 1 refills | Status: AC | PRN

## 2021-01-07 MED ORDER — FLUTICASONE PROPIONATE 50 MCG/ACT NA SUSP: 2.0000 | Freq: Every day | NASAL | 5 refills | Status: DC

## 2021-01-07 MED ORDER — ALBUTEROL SULFATE HFA 108 (90 BASE) MCG/ACT IN AERS: 2.0000 | INHALATION_SPRAY | Freq: Four times a day (QID) | RESPIRATORY_TRACT | 2 refills | Status: AC | PRN

## 2021-01-07 MED ORDER — OLOPATADINE HCL 0.2 % OP SOLN: 1.0000 [drp] | OPHTHALMIC | 5 refills | Status: AC

## 2021-01-07 MED ORDER — CETIRIZINE HCL 10 MG PO TABS: 10.0000 mg | ORAL_TABLET | Freq: Every day | ORAL | 5 refills | Status: DC

## 2021-01-07 MED ORDER — OMEPRAZOLE 40 MG PO CPDR
40.0000 mg | DELAYED_RELEASE_CAPSULE | Freq: Every day | ORAL | 5 refills | Status: AC
Start: 2021-01-07 — End: ?

## 2021-01-07 MED ORDER — FLUTICASONE PROPIONATE HFA 110 MCG/ACT IN AERO: 2.0000 | INHALATION_SPRAY | Freq: Two times a day (BID) | RESPIRATORY_TRACT | 3 refills | Status: DC

## 2021-01-07 NOTE — Patient Instructions (Signed)
Chronic Rhinitis seasonal and perennial allergic: - allergy testing today was positive to grasses, trees, molds, cat, dust mites - allergen avoidance as below - consider allergy shots as long term control of your symptoms by teaching your immune system to be more tolerant of your allergy triggers - Start Nasal Steroid Spray: Options include Flonase (fluticasone), Nasocort (triamcinolone), Nasonex (mometasome) 1- 2 sprays in each nostril daily (can buy over-the-counter if not covered by insurance)  Best results if used daily. - Start over the counter antihistamine daily or daily as needed.   -Your options include Zyrtec (Cetirizine) 10mg , Claritin (Loratadine) 10mg , Allegra (Fexofenadine) 180mg , or Xyzal (Levocetirinze) 5mg   Allergic Conjunctivitis:  - Start Allergy Eye drops: great options include Pataday (Olopatadine) or Zaditor (ketotifen) for eye symptoms daily as needed-both sold over the counter if not covered by insurance.   -Avoid eye drops that say red eye relief as they may contain medications that dry out your eyes.  Concern for moderate persistent Asthma: - your lung testing today did not show obstruction and did not show reversibility with albuterol which is inconsistent with asthma, however, will give trial of medication to determine clinical response - Start Flovent 110, 2 puffs twice a day with a spacer; THIS SHOULD BE USED EVERY DAY - Rinse mouth out after use - Use Albuterol (Proair/Ventolin) 2 puffs every 4-6 hours as needed for chest tightness, wheezing, or coughing - Use Albuterol (Proair/Ventolin) 2 puffs 15 minutes prior to exercise if you have symptoms with activity - Use a spacer with all inhalers - Asthma is not controlled if:  - Symptoms are occurring >2 times a week OR  - >2 times a month nighttime awakenings  - Please call the clinic to schedule a follow up if these symptoms arise  Reflux:  - start omeprazole 40 mg daily-plan for 6 month trial, if controlled would  step down to H2 blocker - lifestyle modifications as below  Follow-up in 6 weeks. ---------------------------------------------------------------------------- Reducing Pollen Exposure  The American Academy of All[employer], Ast[employer] and Immun[employer] suggests the following steps to reduce your exposure to pollen during allergy seasons.    Do not hang sheets or clothing out to dry; pollen may collect on these items. Do not mow lawns or spend time around freshly cut grass; mowing stirs up pollen. Keep windows closed at night.  Keep car windows closed while driving. Minimize morning activities outdoors, a time when pollen counts are usually at their highest. Stay indoors as much as possible when pollen counts or humidity is high and on windy days when pollen tends to remain in the air longer. Use air conditioning when possible.  Many air conditioners have filters that trap the pollen spores. Use a HEPA room air filter to remove pollen form the indoor air you breathe.  Control of Dog or Cat Allergen  Avoidance is the best way to manage a dog or cat allergy. If you have a dog or cat and are allergic to dog or cats, consider removing the dog or cat from the home. If you have a dog or cat but don't want to find it a new home, or if your family wants a pet even though someone in the household is allergic, here are some strategies that may help keep symptoms at bay:  Keep the pet out of your bedroom and restrict it to only a few rooms. Be advised that keeping the dog or cat in only one room will not limit the allergens to that room. Don't  pet, hug or kiss the dog or cat; if you do, wash your hands with soap and water. High-efficiency particulate air (HEPA) cleaners run continuously in a bedroom or living room can reduce allergen levels over time. Regular use of a high-efficiency vacuum cleaner or a central vacuum can reduce allergen levels. Giving your dog or cat a bath at least once a week can reduce airborne  allergen.  Control of Mold Allergen   Mold and fungi can grow on a variety of surfaces provided certain temperature and moisture conditions exist.  Outdoor molds grow on plants, decaying vegetation and soil.  The major outdoor mold, Alternaria and Cladosporium, are found in very high numbers during hot and dry conditions.  Generally, a late Summer - Fall peak is seen for common outdoor fungal spores.  Rain will temporarily lower outdoor mold spore count, but counts rise rapidly when the rainy period ends.  The most important indoor molds are Aspergillus and Penicillium.  Dark, humid and poorly ventilated basements are ideal sites for mold growth.  The next most common sites of mold growth are the bathroom and the kitchen.  Outdoor (Seasonal) Mold Control  Positive outdoor molds via skin testing: Bipolaris (Helminthsporium)  Use air conditioning and keep windows closed Avoid exposure to decaying vegetation. Avoid leaf raking. Avoid grain handling. Consider wearing a face mask if working in moldy areas.    Indoor (Perennial) Mold Control   Positive indoor molds via skin testing: Fusarium, Aureobasidium (Pullulara), Rhizopus, Phoma, and Candida  Maintain humidity below 50%. Clean washable surfaces with 5% bleach solution. Remove sources e.g. contaminated carpets. DUST MITE AVOIDANCE MEASURES:  There are three main measures that need and can be taken to avoid house dust mites:  Reduce accumulation of dust in general -reduce furniture, clothing, carpeting, books, stuffed animals, especially in bedroom  Separate yourself from the dust -use pillow and mattress encasements (can be found at stores such as [employer] or online) -avoid direct exposure to air condition flow -use a HEPA filter device, especially in the bedroom; you can also use a HEPA filter vacuum cleaner -wipe dust with a moist towel instead of a dry towel or broom when cleaning  Decrease mites and/or their  secretions -wash clothing and linen and stuffed animals at highest temperature possible, at least every 2 weeks -stuffed animals can also be placed in a bag and put in a freezer overnight  Despite the above measures, it is impossible to eliminate dust mites or their allergen completely from your home.  With the above measures the burden of mites in your home can be diminished, with the goal of minimizing your allergic symptoms.  Success will be reached only when implementing and using all means together.  -------------------------------------------------------------------------------------------------------------  Gastroesophageal Reflux Induced Respiratory Disease and Laryngopharyngeal Reflux (LPR): Gastroesophageal reflux disease (GERD) is a condition where the contents of the stomach reflux or back up into the esophagus or swallowing tube.  This can result in a variety of clinical symptoms including classic symptoms and atypical symptoms.  Classic symptoms of GERD include: heartburn, chest pain, acid taste in the mouth, and difficulty in swallowing.  Atypical symptoms of GERD include laryngopharyngeal reflux (LPR) and asthma.  LPR occurs when stomach reflux comes all the way up to the throat.  Clinical symptoms include hoarseness, raspy voice, laryngitis, throat clearing, postnasal drip, mucus stuck in the throat, a sensation of a lump in the throat, sore throat, and cough.  Most patients with LPR do not  have classic symptoms of GERD.  Asthma can also be triggered by GERD.  The acid stomach fluid can stimulate nerve fibers in the esophagus which can cause an increase in bronchial muscle tone and narrowing of the airways.  Acid stomach contents may also reflux into the trachea and bronchi of the lungs where it can trigger an asthma attack.  Many people with GERD triggered asthma do not have classic symptoms of GERD.  Diagnosis of LPR and GERD induced asthma is frequently made from a typical history  and response to medications.  It may take several months of medications to see a good response.  Occasionally, a 24-hour esophageal pH probe study must be performed.  Treatment of GERD/LPR includes:   Modification of diet and lifestyle Stop smoking Avoid overeating and lose weight Avoid acidic and fatty foods, chocolate, onions, garlic, peppermint Elevate the head of your bed 6 to 8 inches with blocks or wedge Medications Zantac, Pepcid, Axid, Tagamet Prilosec, Prevacid, Aciphex, Protonix, Nexium Surgery

## 2021-01-11 ENCOUNTER — Encounter: Payer: Self-pay | Admitting: Obstetrics

## 2021-02-18 NOTE — Progress Notes (Deleted)
FOLLOW UP Date of Service/Encounter:  02/18/21   Subjective:  Diamond Farley (DOB: 1996-01-08) is a 25 y.o. female who [facility] in re-evaluation of the following: Asthma, reflux and allergic rhinitis History obtained from: chart review and {Persons; PED relatives w/patient:19415::"patient"}.  For Review, LV was on 01/07/2021 with Dr.Tien Spooner seen for asthma, reflux and allergic rhinitis.  Her spirometry while not consistent with asthma we did decide to try Flovent 110 based on her history.  She was started on nasal steroid spray and over-the-counter antihistamine as well as allergy eyedrops.  Started her on omeprazole 40 mg daily  Pertinent history/diagnostics: Spirometry 01/07/2021: ratio 91%, FEV1 82% predicted without bronchodilator response 01/07/2021 skin testing environmental panel: Positive to grasses trees, molds, cat, intradermal's positive to mold mix for and dust mites  Allergies as of 02/24/2021   No Known Allergies      Medication List        Accurate as of February 18, 2021  4:43 PM. If you have any questions, ask your nurse or doctor.          albuterol 108 (90 Base) MCG/ACT inhaler Commonly known as: VENTOLIN HFA Inhale 2 puffs into the lungs every 6 (six) hours as needed for wheezing or shortness of breath.   albuterol 108 (90 Base) MCG/ACT inhaler Commonly known as: VENTOLIN HFA Inhale 2 puffs into the lungs every 6 (six) hours as needed for wheezing or shortness of breath.   beclomethasone 80 MCG/ACT inhaler Commonly known as: QVAR Inhale 2 puffs into the lungs 2 (two) times daily.   cetirizine 10 MG tablet Commonly known as: ZyrTEC Allergy Take 1 tablet (10 mg total) by mouth daily.   fluticasone 110 MCG/ACT inhaler Commonly known as: Flovent HFA Inhale 2 puffs into the lungs in the morning and at bedtime.   fluticasone 50 MCG/ACT nasal spray Commonly known as: FLONASE Place 2 sprays into both  nostrils daily.   fluticasone 50 MCG/ACT nasal spray Commonly known as: Flonase Place 2 sprays into both nostrils daily.   ibuprofen 800 MG tablet Commonly known as: ADVIL Take 800 mg by mouth every 8 (eight) hours as needed.   ibuprofen 800 MG tablet Commonly known as: ADVIL Take 1 tablet (800 mg total) by mouth every 8 (eight) hours as needed.   metroNIDAZOLE 500 MG tablet Commonly known as: FLAGYL Take 1 tablet (500 mg total) by mouth 2 (two) times daily.   norethindrone 0.35 MG tablet Commonly known as: MICRONOR Take 1 tablet (0.35 mg total) by mouth daily.   Olopatadine HCl 0.2 % Soln Commonly known as: Pataday Place 1 drop into both eyes 1 day or 1 dose.   omeprazole 40 MG capsule Commonly known as: PRILOSEC Take 1 capsule (40 mg total) by mouth daily.   Spacer/Aero-Holding Rudean Curt 1 each by Does not apply route as needed.       Past Medical History:  Diagnosis Date   Asthma    COVID-19 virus infection 12/2019   Eczema    Obesity    Past Surgical History:  Procedure Laterality Date   ADENOIDECTOMY     SINOSCOPY     TONSILLECTOMY     Otherwise, there have been no changes to her past medical history, surgical history, family history, or social history.  ROS: All others negative except as noted per HPI.   Objective:  There were no vitals taken for this visit. There is no height or weight on file  to calculate BMI. Physical Exam: General Appearance:  Alert, cooperative, no distress, appears stated age  Head:  Normocephalic, without obvious abnormality, atraumatic  Eyes:  Conjunctiva clear, EOM's intact  Nose: Nares normal, {Blank multiple:19196:a:"***","hypertrophic turbinates","normal mucosa","no visible anterior polyps","septum midline"}  Throat: Lips, tongue normal; teeth and gums normal, {Blank multiple:19196:a:"***","normal posterior oropharynx","tonsils 2+","tonsils 3+","no tonsillar exudate","+ cobblestoning"}  Neck: Supple, symmetrical   Lungs:   {Blank multiple:19196:a:"***","clear to auscultation bilaterally","end-expiratory wheezing","wheezing throughout"}, Respirations unlabored, {Blank multiple:19196:a:"***","no coughing","intermittent dry coughing"}  Heart:  {Blank multiple:19196:a:"***","regular rate and rhythm","no murmur"}, Appears well perfused  Extremities: No edema  Skin: Skin color, texture, turgor normal, no rashes or lesions on visualized portions of skin  Neurologic: No gross deficits   Reviewed: ***  Spirometry:  Tracings reviewed. Her effort: {Blank single:19197::"Good reproducible efforts.","It was hard to get consistent efforts and there is a question as to whether this reflects a maximal maneuver.","Poor effort, data can not be interpreted.","Variable effort-results affected.","decent for first attempt at spirometry."} FVC: ***L FEV1: ***L, ***% predicted FEV1/FVC ratio: ***% Interpretation: {Blank single:19197::"Spirometry consistent with mild obstructive disease","Spirometry consistent with moderate obstructive disease","Spirometry consistent with severe obstructive disease","Spirometry consistent with possible restrictive disease","Spirometry consistent with mixed obstructive and restrictive disease","Spirometry uninterpretable due to technique","Spirometry consistent with normal pattern","No overt abnormalities noted given today's efforts"}.  Please see scanned spirometry results for details.  Skin Testing: {Blank single:19197::"Select foods","Environmental allergy panel","Environmental allergy panel and select foods","Food allergy panel","None","Deferred due to recent antihistamines use"}. Positive test to: ***. Negative test to: ***.  Results discussed with patient/family.   {Blank single:19197::"Allergy testing results were read and interpreted by myself, documented by clinical staff."," "}  Assessment/Plan  There are no Patient Instructions on file for this visit.  Tonny Bollman, MD  [facility]

## 2021-02-24 ENCOUNTER — Ambulatory Visit: Payer: Managed Care, Other (non HMO) | Admitting: Internal Medicine

## 2021-02-24 DIAGNOSIS — J309 Allergic rhinitis, unspecified: Secondary | ICD-10-CM

## 2021-03-22 ENCOUNTER — Other Ambulatory Visit: Payer: Self-pay

## 2021-03-22 ENCOUNTER — Ambulatory Visit: Payer: Managed Care, Other (non HMO)

## 2021-03-22 ENCOUNTER — Other Ambulatory Visit (HOSPITAL_COMMUNITY)
Admission: RE | Admit: 2021-03-22 | Discharge: 2021-03-22 | Disposition: A | Discharge status: Home / Self Care | Payer: Managed Care, Other (non HMO) | Source: Ambulatory Visit | Attending: Obstetrics and Gynecology | Admitting: Obstetrics and Gynecology

## 2021-03-22 VITALS — BP 120/73 | HR 96

## 2021-03-22 DIAGNOSIS — N898 Other specified noninflammatory disorders of vagina: Secondary | ICD-10-CM

## 2021-03-22 DIAGNOSIS — B379 Candidiasis, unspecified: Secondary | ICD-10-CM

## 2021-03-22 MED ORDER — FLUCONAZOLE 150 MG PO TABS: 150.0000 mg | ORAL_TABLET | Freq: Once | ORAL | 0 refills | Status: AC

## 2021-03-22 NOTE — Progress Notes (Signed)
SUBJECTIVE:  26 y.o. female complains of white and creamy vaginal discharge with itching for 3-4  day(s). Denies abnormal vaginal bleeding or significant pelvic pain or fever. No UTI symptoms. Denies history of known exposure to STD.  No LMP recorded.  OBJECTIVE:  She appears well, afebrile. Urine dipstick: negative for all components.  ASSESSMENT:  Vaginal Discharge  Vaginal Odor   PLAN:  GC, chlamydia, trichomonas, BVAG, CVAG probe sent to lab. Treatment: To be determined once lab results are received ROV prn if symptoms persist or worsen.

## 2021-03-23 LAB — CERVICOVAGINAL ANCILLARY ONLY
Bacterial Vaginitis (gardnerella): NEGATIVE
Candida Glabrata: NEGATIVE
Candida Vaginitis: NEGATIVE
Chlamydia: NEGATIVE
Comment: NEGATIVE
Comment: NEGATIVE
Comment: NEGATIVE
Comment: NEGATIVE
Comment: NEGATIVE
Comment: NORMAL
Neisseria Gonorrhea: NEGATIVE
Trichomonas: NEGATIVE

## 2021-11-18 ENCOUNTER — Other Ambulatory Visit: Payer: Self-pay | Admitting: Obstetrics

## 2021-11-18 DIAGNOSIS — N946 Dysmenorrhea, unspecified: Secondary | ICD-10-CM

## 2021-12-16 IMAGING — CR DG HAND COMPLETE 3+V*R*
3 series · 3 of 3 positions shown · non-contrast
Comparison: None.

CLINICAL DATA: Pain at right fifth MCP joint

EXAM:
RIGHT HAND - COMPLETE 3+ VIEW

[x hand pa right]
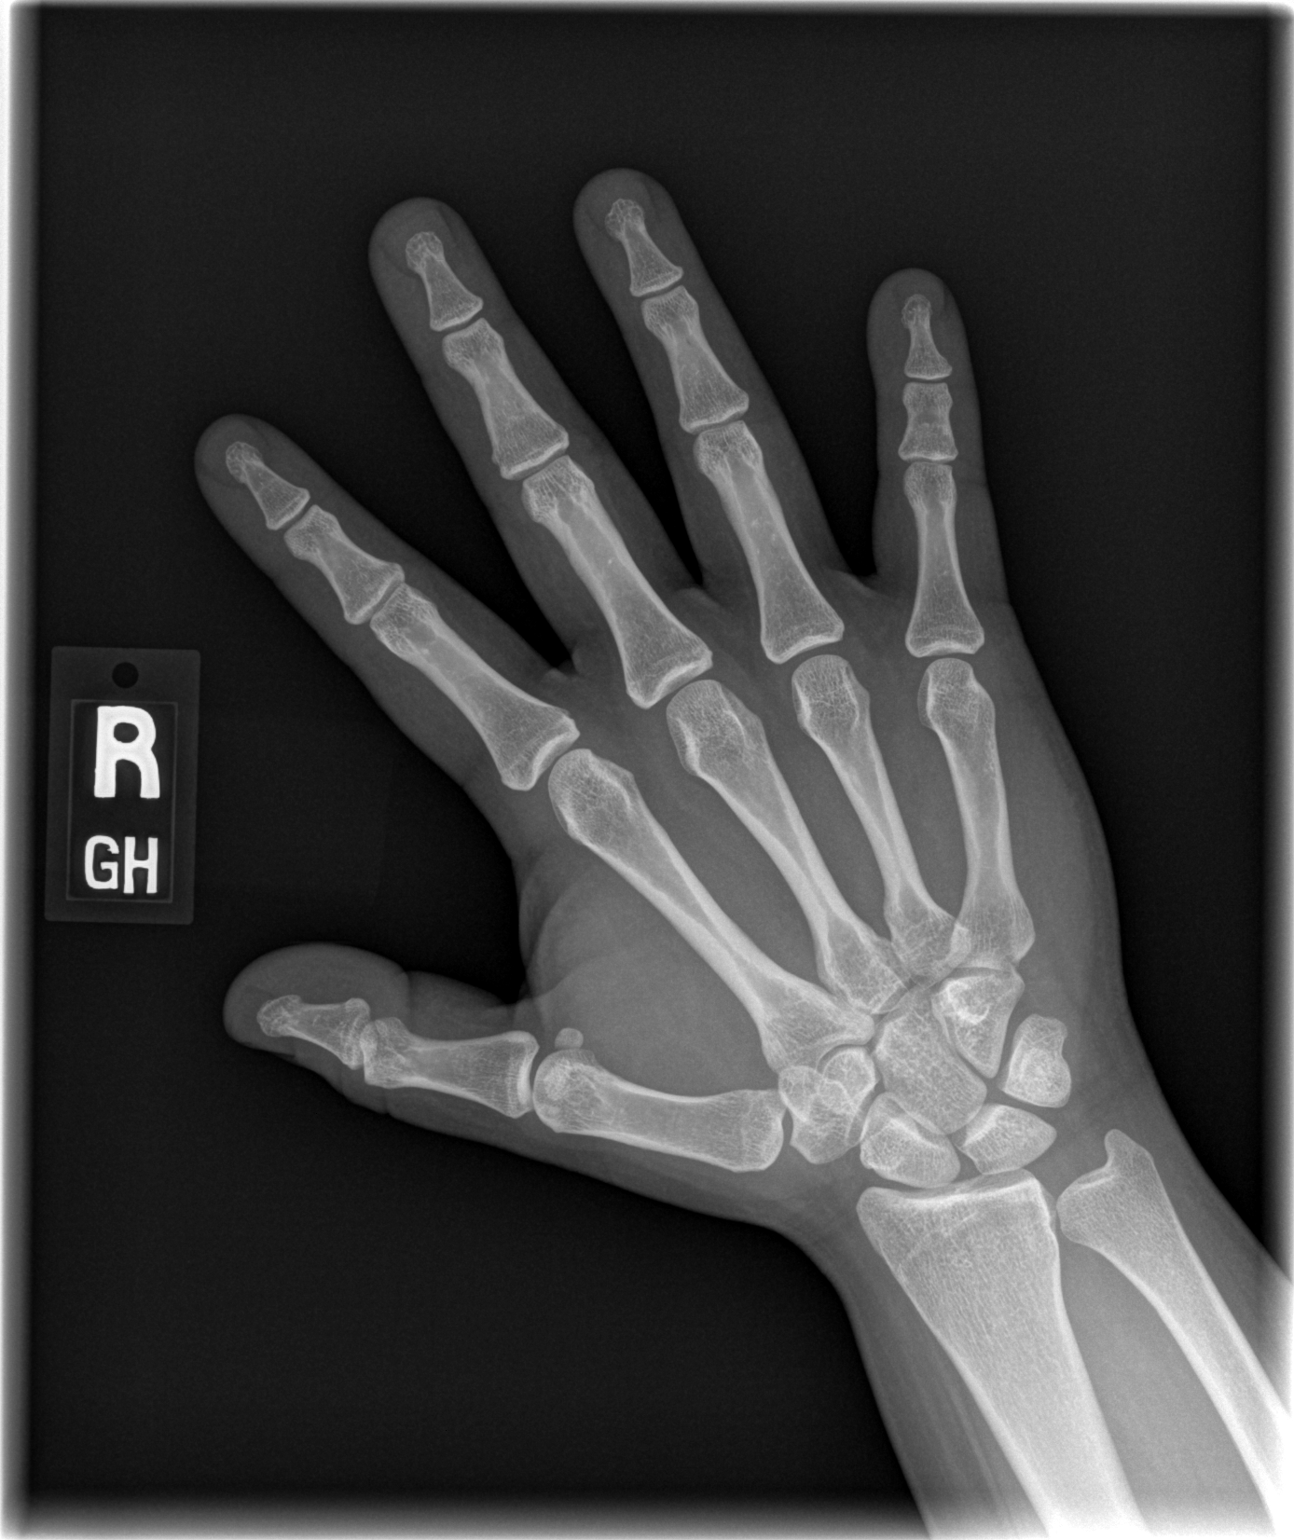

[x hand oblique right]
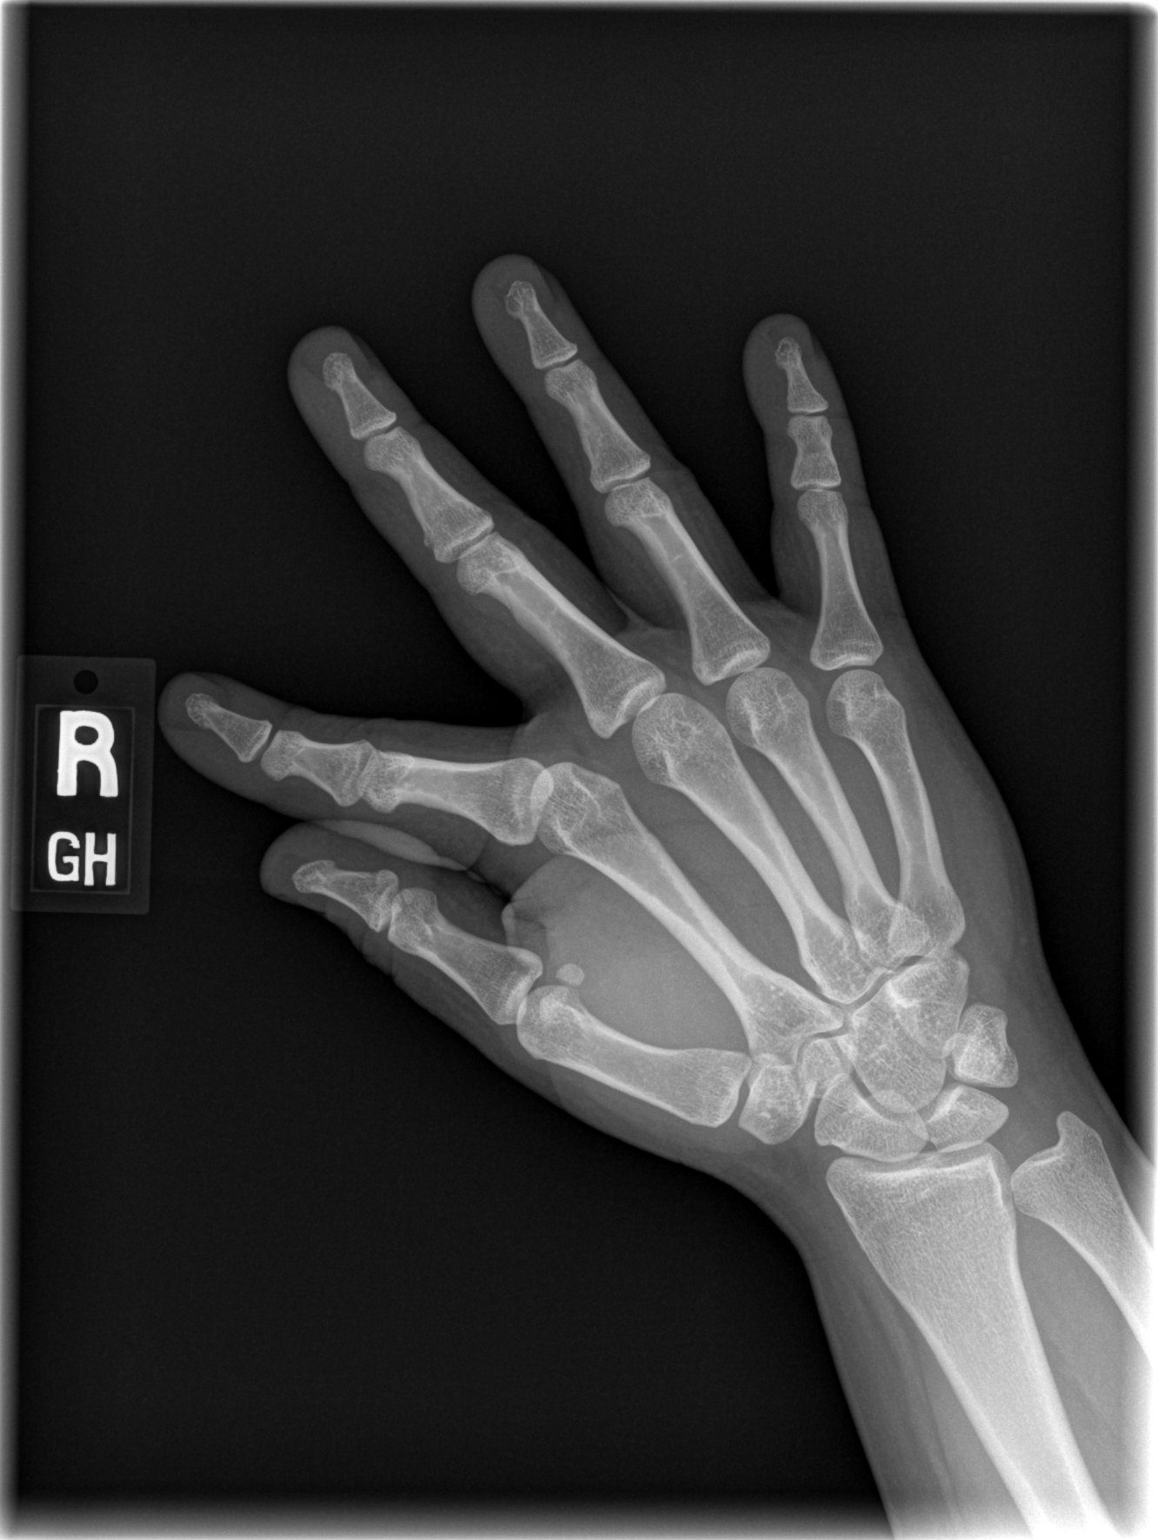

[x hand lat right]
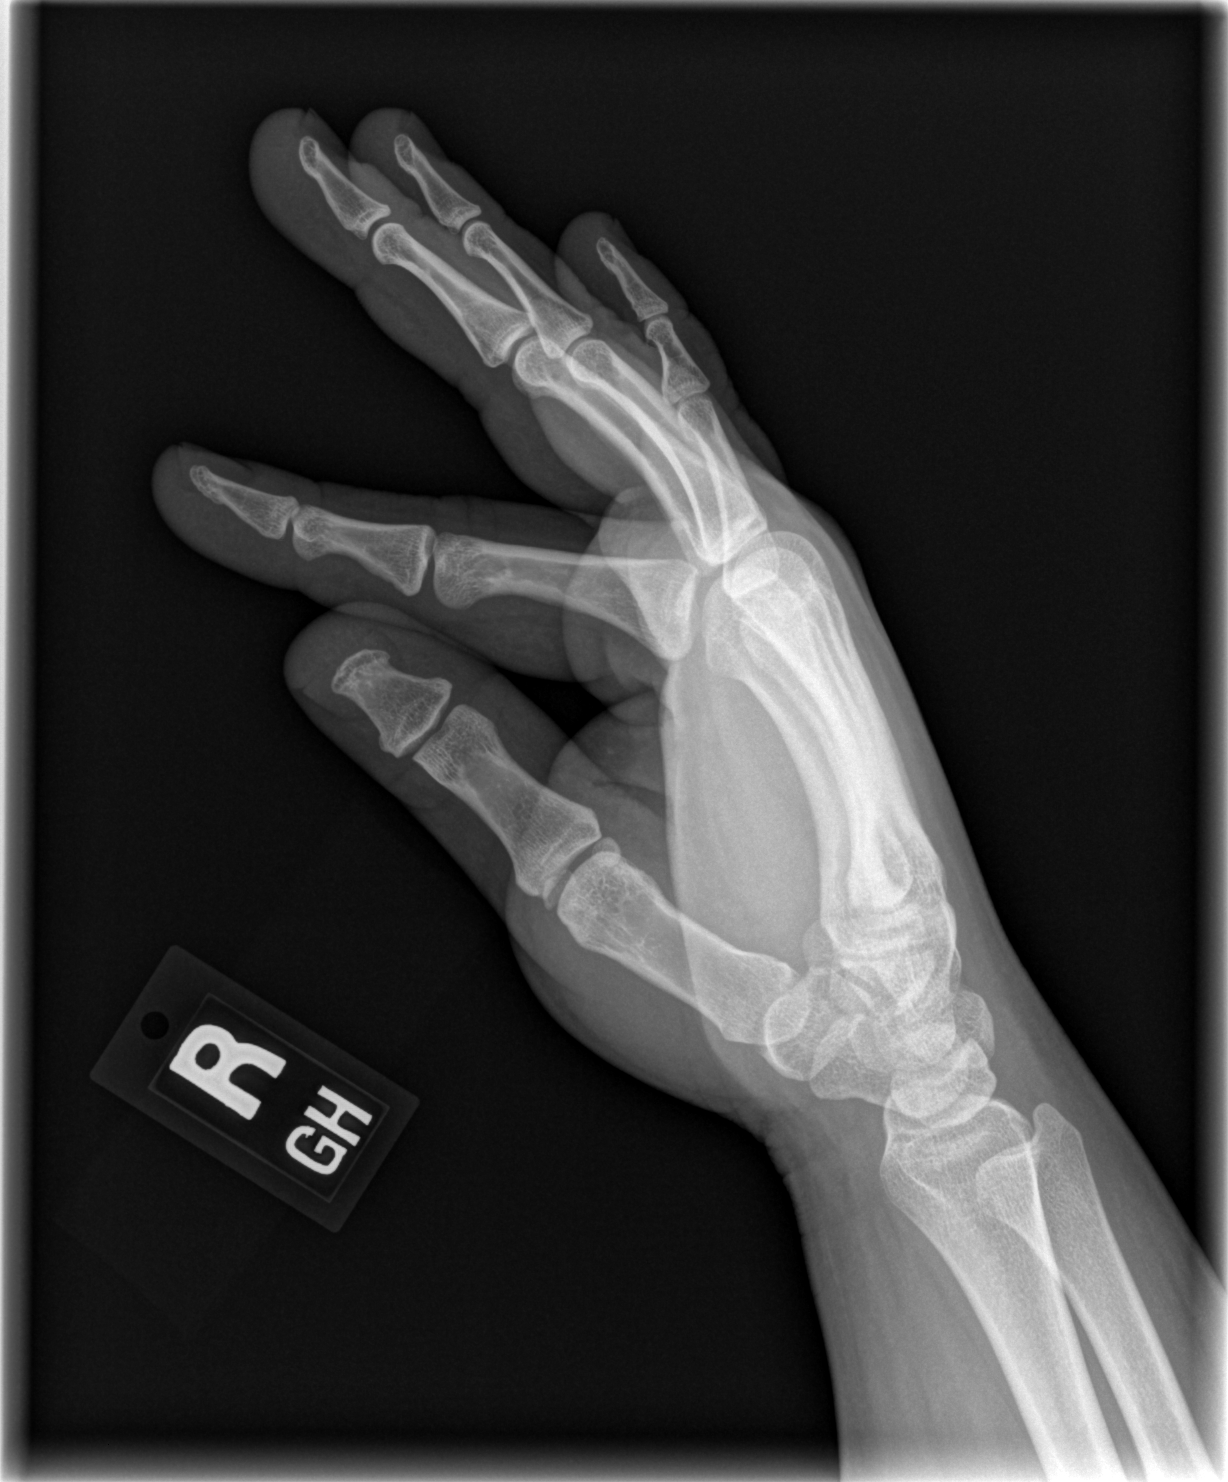

[3 of 3 positions shown; findings below may reference images not displayed]

FINDINGS: There is no acute fracture or dislocation. Bony alignment is normal.
The joint spaces are preserved. The soft tissues are unremarkable.
IMPRESSION: Normal hand radiographs.

## 2022-02-12 ENCOUNTER — Encounter (HOSPITAL_COMMUNITY): Payer: Self-pay

## 2022-02-12 ENCOUNTER — Ambulatory Visit (HOSPITAL_COMMUNITY)
Admission: EM | Admit: 2022-02-12 | Discharge: 2022-02-12 | Disposition: A | Discharge status: Home / Self Care | Payer: Managed Care, Other (non HMO) | Attending: Physician Assistant | Admitting: Physician Assistant

## 2022-02-12 ENCOUNTER — Ambulatory Visit (HOSPITAL_COMMUNITY): Payer: Managed Care, Other (non HMO)

## 2022-02-12 DIAGNOSIS — M25521 Pain in right elbow: Secondary | ICD-10-CM | POA: Diagnosis not present

## 2022-02-12 DIAGNOSIS — S4991XA Unspecified injury of right shoulder and upper arm, initial encounter: Secondary | ICD-10-CM

## 2022-02-12 DIAGNOSIS — M25511 Pain in right shoulder: Secondary | ICD-10-CM

## 2022-02-12 DIAGNOSIS — M25531 Pain in right wrist: Secondary | ICD-10-CM

## 2022-02-12 MED ORDER — CYCLOBENZAPRINE HCL 5 MG PO TABS: 5.0000 mg | ORAL_TABLET | Freq: Three times a day (TID) | ORAL | 0 refills | Status: AC | PRN
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
## 2022-02-12 NOTE — ED Notes (Signed)
Patient called x1 from waiting room. Patient's cell phone number called x2.

## 2022-02-12 NOTE — Discharge Instructions (Signed)
Your xrays are negative for fracture.  Recommend ice to affected areas and gentle stretching I have prescribed a muscle relaxer for you to take as needed for muscle spasm.  You can take ibuprofen or tylenol as needed.

## 2022-02-22 ENCOUNTER — Ambulatory Visit (HOSPITAL_COMMUNITY)
Admission: EM | Admit: 2022-02-22 | Discharge: 2022-02-22 | Disposition: A | Discharge status: Home / Self Care | Payer: Managed Care, Other (non HMO) | Attending: Emergency Medicine | Admitting: Emergency Medicine

## 2022-02-22 ENCOUNTER — Encounter (HOSPITAL_COMMUNITY): Payer: Self-pay

## 2022-02-22 DIAGNOSIS — J069 Acute upper respiratory infection, unspecified: Secondary | ICD-10-CM

## 2022-02-22 DIAGNOSIS — U071 COVID-19: Secondary | ICD-10-CM

## 2022-02-22 MED ORDER — DM-GUAIFENESIN ER 30-600 MG PO TB12: 1.0000 | ORAL_TABLET | Freq: Two times a day (BID) | ORAL | 0 refills | Status: AC

## 2022-02-22 MED ORDER — FLUTICASONE PROPIONATE 50 MCG/ACT NA SUSP: 2.0000 | Freq: Every day | NASAL | 2 refills | Status: AC

## 2022-02-22 NOTE — ED Triage Notes (Signed)
Chief Complaint: cough is dry with chest tightness, runny nose, congestion, positive home COVID test. Test done Saturday night. Sore throat with cough and swallowing. Felt feverish but highest was 99. Chills and body aches.   Onset: symptoms start with body aches Friday   Prescriptions or OTC medications tried: No    Sick exposure: No  New foods, medications, or products: No  Recent Travel: No

## 2022-02-22 NOTE — ED Provider Notes (Signed)
[facility]    CSN: [id number] Arrival date & time: 02/22/22  0907     History   Chief Complaint Chief Complaint  Patient presents with   Covid Positive   URI   Chills    HPI Diamond Farley is a 26 y.o. female.  Presents with 4-day history of URI symptoms Had a positive home COVID test done 3 nights ago Reports dry cough, runny nose and congestion Some sore throat with cough, chills and body aches Reports left ear pressure No fevers  Tried tylenol and ibuprofen  No known sick contacts  History of asthma, has been using inhaler Some shortness of breath with cough. Denies wheezing  Past Medical History:  Diagnosis Date   Asthma    COVID-19 virus infection 12/2019   Eczema    Obesity     Patient Active Problem List   Diagnosis Date Noted   Seasonal and perennial allergic rhinitis 01/07/2021   Allergic conjunctivitis of both eyes 01/07/2021   Chronic cough 01/07/2021   Gastroesophageal reflux disease 01/07/2021   Obesity    COVID-19 virus infection 12/2019   Asthma 09/14/2018   Borderline hypertension 09/14/2018   Pre-diabetes 09/14/2018    Past Surgical History:  Procedure Laterality Date   ADENOIDECTOMY     SINOSCOPY     TONSILLECTOMY      OB History   No obstetric history on file.      Home Medications    Prior to Admission medications   Medication Sig Start Date End Date Taking? Authorizing Provider  albuterol (PROVENTIL HFA;VENTOLIN HFA) 108 (90 Base) MCG/ACT inhaler Inhale 2 puffs into the lungs every 6 (six) hours as needed for wheezing or shortness of breath.   Yes [provider]  cetirizine (ZYRTEC ALLERGY) 10 MG tablet Take 1 tablet (10 mg total) by mouth daily. 01/07/21  Yes Verlee Monte, MD  dextromethorphan-guaiFENesin [facility] DM) 30-600 MG 12hr tablet Take 1 tablet by mouth 2 (two) times daily for 5 days. 02/22/22 02/27/22 Yes Travares Nelles, Lurena Joiner, PA-C  fluticasone (FLONASE) 50 MCG/ACT nasal spray Place 2  sprays into both nostrils daily. 02/22/22  Yes Qusay Villada, Lurena Joiner, PA-C  Olopatadine HCl (PATADAY) 0.2 % SOLN Place 1 drop into both eyes 1 day or 1 dose. 01/07/21  Yes Verlee Monte, MD  Spacer/Aero-Holding Deretha Emory DEVI 1 each by Does not apply route as needed. 01/07/21  Yes Verlee Monte, MD  albuterol (VENTOLIN HFA) 108 (90 Base) MCG/ACT inhaler Inhale 2 puffs into the lungs every 6 (six) hours as needed for wheezing or shortness of breath. 01/07/21   Verlee Monte, MD  beclomethasone (QVAR) 80 MCG/ACT inhaler Inhale 2 puffs into the lungs 2 (two) times daily.    [provider]  fluticasone (FLOVENT HFA) 110 MCG/ACT inhaler Inhale 2 puffs into the lungs in the morning and at bedtime. 01/07/21   Verlee Monte, MD  ibuprofen (ADVIL) 800 MG tablet Take 1 tablet (800 mg total) by mouth every 8 (eight) hours as needed. 09/14/20   Brock Bad, MD  ibuprofen (ADVIL) 800 MG tablet Take 800 mg by mouth every 8 (eight) hours as needed.    [provider]  norethindrone (MICRONOR) 0.35 MG tablet Take 1 tablet (0.35 mg total) by mouth daily. 09/14/20   Brock Bad, MD  omeprazole (PRILOSEC) 40 MG capsule Take 1 capsule (40 mg total) by mouth daily. 01/07/21   Verlee Monte, MD    Family History Family History  Problem  Relation Age of Onset   Asthma Brother    Allergic rhinitis Brother    Allergic rhinitis Brother    Diabetes Mother    Diabetes Maternal Grandmother    Hypertension Maternal Grandfather    Diabetes Paternal Grandmother     Social History Social History   Tobacco Use   Smoking status: Never   Smokeless tobacco: Never  Vaping Use   Vaping Use: Never used  Substance Use Topics   Alcohol use: Yes    Comment: occ   Drug use: Never    Types: Marijuana    Comment: occ     Allergies   Patient has no known allergies.   Review of Systems Review of Systems As per HPI  Physical Exam Triage Vital Signs ED Triage Vitals  Enc Vitals Group      BP 02/22/22 1131 113/77     Pulse Rate 02/22/22 1131 74     Resp 02/22/22 1131 16     Temp 02/22/22 1131 98.6 F (37 C)     Temp Source 02/22/22 1131 Oral     SpO2 02/22/22 1131 98 %     Weight --      Height --      Head Circumference --      Peak Flow --      Pain Score 02/22/22 1129 9     Pain Loc --      Pain Edu? --      Excl. in [place]? --    No data found.  Updated Vital Signs BP 113/77 (BP Location: Left Arm)   Pulse 74   Temp 98.6 F (37 C) (Oral)   Resp 16   LMP 02/08/2022 (Approximate)   SpO2 98%     Physical Exam Vitals and nursing note reviewed.  Constitutional:      General: She is not in acute distress.    Comments: Normal WOB, no respiratory distress, speaks in full sentences   HENT:     Right Ear: Tympanic membrane and ear canal normal.     Left Ear: Tympanic membrane and ear canal normal.     Ears:     Comments: Clear fluid behind Tms, no infection    Nose: No congestion or rhinorrhea.     Mouth/Throat:     Mouth: Mucous membranes are moist.     Pharynx: Oropharynx is clear. No posterior oropharyngeal erythema.  Eyes:     Conjunctiva/sclera: Conjunctivae normal.  Cardiovascular:     Rate and Rhythm: Normal rate and regular rhythm.     Pulses: Normal pulses.     Heart sounds: Normal heart sounds.  Pulmonary:     Effort: Pulmonary effort is normal.     Breath sounds: Normal breath sounds. No wheezing.  Musculoskeletal:     Cervical back: Normal range of motion.  Lymphadenopathy:     Cervical: No cervical adenopathy.  Skin:    General: Skin is warm and dry.  Neurological:     Mental Status: She is alert and oriented to person, place, and time.     UC Treatments / Results  Labs (all labs ordered are listed, but only abnormal results are displayed) Labs Reviewed - No data to display  EKG  Radiology No results found.  Procedures Procedures   Medications Ordered in UC Medications - No data to display  Initial Impression /  Assessment and Plan / UC Course  I have reviewed the triage vital signs and the nursing notes.  Pertinent  labs & imaging results that were available during my care of the patient were reviewed by me and considered in my medical decision making (see chart for details).  COVID positive at home, defer repeat testing today Discussed symptomatic care at home (mucinex DM, flonase, allergy med, fluids) Recommend using inhaler q6 hours for the next several days. No concern for asthma exacerbation at this time Work note provided. Return precautions discussed. Patient agrees to plan  Final Clinical Impressions(s) / UC Diagnoses   Final diagnoses:  Positive self-administered antigen test for COVID-19  Viral upper respiratory tract infection     Discharge Instructions      I recommend using the Mucinex DM twice daily for cough and congestion.  Additionally add the nasal spray to reduce nasal congestion and pressure in the ears.  Use inhaler every 6 hours for the next several days.  Drink lots of fluids.  You may have several more days of symptoms.  Cough can linger for several weeks but should start to improve.  Please contact your employer regarding Covid quarantine protocol.     ED Prescriptions     Medication Sig Dispense Auth. Provider   dextromethorphan-guaiFENesin (MUCINEX DM) 30-600 MG 12hr tablet Take 1 tablet by mouth 2 (two) times daily for 5 days. 10 tablet Haydee Jabbour, PA-C   fluticasone (FLONASE) 50 MCG/ACT nasal spray Place 2 sprays into both nostrils daily. 11.1 mL Kimyatta Lecy, Wells Guiles, PA-C      PDMP not reviewed this encounter.   Zylpha Poynor, Wells Guiles, PA-C 02/22/22 1230

## 2022-02-22 NOTE — Discharge Instructions (Signed)
I recommend using the Mucinex DM twice daily for cough and congestion.  Additionally add the nasal spray to reduce nasal congestion and pressure in the ears.  Use inhaler every 6 hours for the next several days.  Drink lots of fluids.  You may have several more days of symptoms.  Cough can linger for several weeks but should start to improve.  Please contact your employer regarding Covid quarantine protocol.

## 2022-07-01 ENCOUNTER — Other Ambulatory Visit (HOSPITAL_COMMUNITY)
Admission: RE | Admit: 2022-07-01 | Discharge: 2022-07-01 | Disposition: A | Discharge status: Home / Self Care | Payer: Managed Care, Other (non HMO) | Source: Ambulatory Visit | Attending: Obstetrics and Gynecology | Admitting: Obstetrics and Gynecology

## 2022-07-01 ENCOUNTER — Ambulatory Visit (INDEPENDENT_AMBULATORY_CARE_PROVIDER_SITE_OTHER): Payer: Managed Care, Other (non HMO)

## 2022-07-01 DIAGNOSIS — N898 Other specified noninflammatory disorders of vagina: Secondary | ICD-10-CM | POA: Insufficient documentation

## 2022-07-01 NOTE — Progress Notes (Signed)
..  SUBJECTIVE:  27 y.o. female complains of vaginal discharge and possible exposure to a STD Denies abnormal vaginal bleeding or significant pelvic pain or fever. No UTI symptoms. Denies history of known exposure to STD.  No LMP recorded.  OBJECTIVE:  She appears well, afebrile. Urine dipstick: not done.  ASSESSMENT:  Vaginal Discharge  Possible STD exposure  PLAN:  GC, chlamydia, trichomonas, BVAG, CVAG probe sent to lab. Treatment: To be determined once lab results are received ROV prn if symptoms persist or worsen.

## 2022-07-02 LAB — HIV ANTIBODY (ROUTINE TESTING W REFLEX): HIV Screen 4th Generation wRfx: NONREACTIVE

## 2022-07-02 LAB — HEPATITIS B SURFACE ANTIGEN: Hepatitis B Surface Ag: NEGATIVE

## 2022-07-02 LAB — RPR: RPR Ser Ql: NONREACTIVE

## 2022-07-02 LAB — HEPATITIS C ANTIBODY: Hep C Virus Ab: NONREACTIVE

## 2022-07-04 LAB — CERVICOVAGINAL ANCILLARY ONLY
Bacterial Vaginitis (gardnerella): POSITIVE — AB
Candida Glabrata: NEGATIVE
Candida Vaginitis: NEGATIVE
Chlamydia: NEGATIVE
Comment: NEGATIVE
Comment: NEGATIVE
Comment: NEGATIVE
Comment: NEGATIVE
Comment: NEGATIVE
Comment: NORMAL
Neisseria Gonorrhea: NEGATIVE
Trichomonas: NEGATIVE

## 2022-07-04 MED ORDER — METRONIDAZOLE 500 MG PO TABS: 500.0000 mg | ORAL_TABLET | Freq: Two times a day (BID) | ORAL | 0 refills | Status: AC

## 2022-07-04 NOTE — Addendum Note (Signed)
Addended by: Catalina Antigua on: 07/04/2022 02:08 PM   Modules accepted: Orders

## 2022-07-20 ENCOUNTER — Ambulatory Visit: Payer: Managed Care, Other (non HMO) | Admitting: Obstetrics

## 2022-07-22 ENCOUNTER — Encounter: Payer: Self-pay | Admitting: Obstetrics and Gynecology

## 2022-07-22 ENCOUNTER — Ambulatory Visit (INDEPENDENT_AMBULATORY_CARE_PROVIDER_SITE_OTHER): Payer: Managed Care, Other (non HMO) | Admitting: Obstetrics and Gynecology

## 2022-07-22 ENCOUNTER — Other Ambulatory Visit (HOSPITAL_COMMUNITY)
Admission: RE | Admit: 2022-07-22 | Discharge: 2022-07-22 | Disposition: A | Discharge status: Home / Self Care | Payer: Managed Care, Other (non HMO) | Source: Ambulatory Visit | Attending: Obstetrics | Admitting: Obstetrics

## 2022-07-22 VITALS — BP 132/85 | HR 68 | Ht 60.0 in | Wt 191.0 lb

## 2022-07-22 DIAGNOSIS — Z01419 Encounter for gynecological examination (general) (routine) without abnormal findings: Secondary | ICD-10-CM

## 2022-07-22 NOTE — Progress Notes (Signed)
Diamond Farley is a 27 y.o. G0P0000 female here for a routine annual gynecologic exam.  Current complaints: None.   Denies abnormal vaginal bleeding, discharge, pelvic pain, problems with intercourse or other gynecologic concerns.    Gynecologic History Patient's last menstrual period was 07/05/2022 (approximate). Contraception: none Last Pap: 2016. Results were: normal Last mammogram: NA.   Obstetric History OB History  Gravida Para Term Preterm AB Living  0 0 0 0 0 0  SAB IAB Ectopic Multiple Live Births  0 0 0 0 0    Past Medical History:  Diagnosis Date   Asthma    COVID-19 virus infection 12/2019   Eczema    Obesity     Past Surgical History:  Procedure Laterality Date   ADENOIDECTOMY     SINOSCOPY     TONSILLECTOMY      Current Outpatient Medications on File Prior to Visit  Medication Sig Dispense Refill   albuterol (PROVENTIL HFA;VENTOLIN HFA) 108 (90 Base) MCG/ACT inhaler Inhale 2 puffs into the lungs every 6 (six) hours as needed for wheezing or shortness of breath.     albuterol (VENTOLIN HFA) 108 (90 Base) MCG/ACT inhaler Inhale 2 puffs into the lungs every 6 (six) hours as needed for wheezing or shortness of breath. 8 g 2   beclomethasone (QVAR) 80 MCG/ACT inhaler Inhale 2 puffs into the lungs 2 (two) times daily.     cetirizine (ZYRTEC ALLERGY) 10 MG tablet Take 1 tablet (10 mg total) by mouth daily. 30 tablet 5   fluticasone (FLONASE) 50 MCG/ACT nasal spray Place 2 sprays into both nostrils daily. 11.1 mL 2   fluticasone (FLOVENT HFA) 110 MCG/ACT inhaler Inhale 2 puffs into the lungs in the morning and at bedtime. 1 each 3   ibuprofen (ADVIL) 800 MG tablet Take 1 tablet (800 mg total) by mouth every 8 (eight) hours as needed. 30 tablet 5   metroNIDAZOLE (FLAGYL) 500 MG tablet Take 1 tablet (500 mg total) by mouth 2 (two) times daily. 14 tablet 0   Olopatadine HCl (PATADAY) 0.2 % SOLN Place 1 drop into both eyes 1 day or 1 dose. 2.5 mL 5   omeprazole  (PRILOSEC) 40 MG capsule Take 1 capsule (40 mg total) by mouth daily. 30 capsule 5   Spacer/Aero-Holding Chambers DEVI 1 each by Does not apply route as needed. 1 each 1   No current facility-administered medications on file prior to visit.    No Known Allergies  Social History   Socioeconomic History   Marital status: Unknown    Spouse name: Not on file   Number of children: Not on file   Years of education: Not on file   Highest education level: Not on file  Occupational History   Not on file  Tobacco Use   Smoking status: Never   Smokeless tobacco: Never  Vaping Use   Vaping Use: Never used  Substance and Sexual Activity   Alcohol use: Not Currently    Comment: occ   Drug use: Not Currently    Types: Marijuana    Comment: occ   Sexual activity: Yes    Partners: Male    Birth control/protection: None  Other Topics Concern   Not on file  Social History Narrative   ** Merged History Encounter **       Social Determinants of Health   Financial Resource Strain: Not on file  Food Insecurity: Not on file  Transportation Needs: Not on file  Physical Activity: Not on  file  Stress: Not on file  Social Connections: Not on file  Intimate Partner Violence: Not on file    Family History  Problem Relation Age of Onset   Asthma Brother    Allergic rhinitis Brother    Allergic rhinitis Brother    Diabetes Mother    Diabetes Maternal Grandmother    Hypertension Maternal Grandfather    Diabetes Paternal Grandmother     The following portions of the patient's history were reviewed and updated as appropriate: allergies, current medications, past family history, past medical history, past social history, past surgical history and problem list.  Review of Systems Pertinent items noted in HPI and remainder of comprehensive ROS otherwise negative.   Objective:  BP (!) 133/92   Pulse 68   Ht 5' (1.524 m)   Wt 191 lb (86.6 kg)   LMP 07/05/2022 (Approximate)   BMI 37.30  kg/m  Chaperone present CONSTITUTIONAL: Well-developed, well-nourished female in no acute distress.  HENT:  Normocephalic, atraumatic, External right and left ear normal. Oropharynx is clear and moist EYES: Conjunctivae and EOM are normal. Pupils are equal, round, and reactive to light. No scleral icterus.  NECK: Normal range of motion, supple, no masses.  Normal thyroid.  SKIN: Skin is warm and dry. No rash noted. Not diaphoretic. No erythema. No pallor. NEUROLGIC: Alert and oriented to person, place, and time. Normal reflexes, muscle tone coordination. No cranial nerve deficit noted. PSYCHIATRIC: Normal mood and affect. Normal behavior. Normal judgment and thought content. CARDIOVASCULAR: Normal heart rate noted, regular rhythm RESPIRATORY: Clear to auscultation bilaterally. Effort and breath sounds normal, no problems with respiration noted. BREASTS: Symmetric in size. No masses, skin changes, nipple drainage, or lymphadenopathy. ABDOMEN: Soft, normal bowel sounds, no distention noted.  No tenderness, rebound or guarding.  PELVIC: Normal appearing external genitalia; normal appearing vaginal mucosa and cervix.  No abnormal discharge noted.  Pap smear obtained.  Normal uterine size, no other palpable masses, no uterine or adnexal tenderness. MUSCULOSKELETAL: Normal range of motion. No tenderness.  No cyanosis, clubbing, or edema.  2+ distal pulses.   Assessment:  Annual gynecologic examination with pap smear   Plan:  Will follow up results of pap smear and manage accordingly. Routine preventative health maintenance measures emphasized. Please refer to After Visit Summary for other counseling recommendations.    Hermina Staggers, MD, FACOG Attending Obstetrician & Gynecologist Center for [facility], [facility]

## 2022-07-22 NOTE — Progress Notes (Signed)
Pt has positive depression/anxiety screening.  Pt states she is doing well and doesn't not need counseling.

## 2022-07-28 LAB — CYTOLOGY - PAP
Adequacy: ABSENT
Comment: NEGATIVE
Diagnosis: NEGATIVE
High risk HPV: NEGATIVE

## 2022-11-08 ENCOUNTER — Ambulatory Visit: Payer: Managed Care, Other (non HMO) | Admitting: Emergency Medicine

## 2022-11-08 ENCOUNTER — Other Ambulatory Visit (HOSPITAL_COMMUNITY)
Admission: RE | Admit: 2022-11-08 | Discharge: 2022-11-08 | Disposition: A | Discharge status: Home / Self Care | Payer: Managed Care, Other (non HMO) | Source: Ambulatory Visit | Attending: Obstetrics and Gynecology | Admitting: Obstetrics and Gynecology

## 2022-11-08 VITALS — BP 109/71 | HR 73 | Ht 60.0 in | Wt 190.5 lb

## 2022-11-08 DIAGNOSIS — N898 Other specified noninflammatory disorders of vagina: Secondary | ICD-10-CM

## 2022-11-08 NOTE — Progress Notes (Signed)
SUBJECTIVE:  27 y.o. female complains of bloody vaginal discharge for 5 day(s). Denies abnormal vaginal bleeding or significant pelvic pain or fever. No UTI symptoms. Denies history of known exposure to STD.  No LMP recorded.  OBJECTIVE:  She appears well, afebrile. Urine dipstick: not done.  ASSESSMENT:  Vaginal Discharge  Vaginal Odor   PLAN:  GC, chlamydia, trichomonas, BVAG, CVAG probe sent to lab. Treatment: To be determined once lab results are received ROV prn if symptoms persist or worsen.

## 2022-11-09 LAB — CERVICOVAGINAL ANCILLARY ONLY
Bacterial Vaginitis (gardnerella): NEGATIVE
Candida Glabrata: NEGATIVE
Candida Vaginitis: NEGATIVE
Chlamydia: NEGATIVE
Comment: NEGATIVE
Comment: NEGATIVE
Comment: NEGATIVE
Comment: NEGATIVE
Comment: NEGATIVE
Comment: NORMAL
Neisseria Gonorrhea: NEGATIVE
Trichomonas: NEGATIVE

## 2022-12-05 ENCOUNTER — Ambulatory Visit: Payer: Managed Care, Other (non HMO) | Admitting: Family Medicine

## 2022-12-26 ENCOUNTER — Ambulatory Visit: Payer: Managed Care, Other (non HMO) | Admitting: Family Medicine

## 2022-12-26 ENCOUNTER — Encounter: Payer: Self-pay | Admitting: Family Medicine

## 2022-12-26 ENCOUNTER — Other Ambulatory Visit: Payer: Self-pay

## 2022-12-26 VITALS — BP 126/78 | HR 69 | Temp 98.4°F | Resp 16 | Ht 60.25 in | Wt 195.4 lb

## 2022-12-26 DIAGNOSIS — L501 Idiopathic urticaria: Secondary | ICD-10-CM | POA: Insufficient documentation

## 2022-12-26 DIAGNOSIS — H1013 Acute atopic conjunctivitis, bilateral: Secondary | ICD-10-CM

## 2022-12-26 DIAGNOSIS — J3089 Other allergic rhinitis: Secondary | ICD-10-CM

## 2022-12-26 DIAGNOSIS — J302 Other seasonal allergic rhinitis: Secondary | ICD-10-CM

## 2022-12-26 DIAGNOSIS — J4520 Mild intermittent asthma, uncomplicated: Secondary | ICD-10-CM | POA: Diagnosis not present

## 2022-12-26 DIAGNOSIS — K219 Gastro-esophageal reflux disease without esophagitis: Secondary | ICD-10-CM

## 2022-12-26 MED ORDER — QVAR REDIHALER 40 MCG/ACT IN AERB: 2.0000 | INHALATION_SPRAY | Freq: Two times a day (BID) | RESPIRATORY_TRACT | 3 refills | Status: AC

## 2022-12-26 NOTE — Progress Notes (Signed)
[address] [place] [address] Dept: [phone]  FOLLOW UP NOTE  Patient ID: Diamond Farley, female    DOB: Oct 31, 1995  Age: 27 y.o. MRN: [id number] Date of Office Visit: 12/26/2022  Assessment  Chief Complaint: Rash (Widespread constant pruritic, red, bumpy rash. Started mid August. Severity varies by day. Pcp provided a cream and medication which did not cure, but did calm symptoms.)  HPI Diamond Farley is a 27 year old female who presents to the clinic for follow-up visit.  She was last seen in this clinic on 01/11/2021 by Dr. Maurine Minister as a new patient for evaluation of asthma, allergic rhinitis, allergic conjunctivitis, and reflux.    Discussed the use of AI scribe software for clinical note transcription with the patient, who gave verbal consent to proceed.  History of Present Illness   The patient presents with a persistent rash of approximately two and a half months duration. The rash has not responded to treatment with ketoconazole and a topical ointment, prescribed by her primary care physician. The rash has spread and is now present on the hands, palms, calves, feet, stomach, and thighs. The patient describes the rash as itchy and notes that while some spots have resolved, new ones continue to appear. The patient denies any new foods, new medications, personal care products, or insect stings.  She denies recent illness and has not experienced fever, sweats or chills.  She denies sick contacts.  She has not experienced a similar rash in the past, and no one else in her household is affected.  She reports that she had previously taken an antihistamine, however, has been out of this medication for the last several weeks.  Asthma is reported as moderately well-controlled with cough occurring in the daytime and nighttime as the main symptom.  She reports this cough is sometimes dry and sometimes produces clear mucus.  She denies shortness of breath or wheeze with activity or  rest.  She reports that she uses albuterol at least once a day in addition to albuterol 2 to 3 days a week for relief of symptoms.  She is not currently using an asthma maintenance inhaler, however, she has previously used Flovent 110 with relief of symptoms.  Allergic rhinitis is reported as moderately well-controlled with occasional symptoms including clear rhinorrhea, nasal congestion, sneezing, and she reports frequent postnasal drainage.  She continues Flonase daily and is out of antihistamine at this time.  She is not currently using a saline nasal rinse. Her last environmental allergy skin testing was on 01/07/2021 and was positive to grass pollen, tree pollen, mold, cat, and dust mite.  Reflux is reported as well-controlled with no symptoms including heartburn or vomiting.  She is not currently taking a medication for reflux, however, she has previously taken omeprazole with relief of symptoms.  Her current medications are listed in the chart.  Drug Allergies:  No Known Allergies  Physical Exam: BP 126/78 (BP Location: Right Arm, Cuff Size: Large)   Pulse 69   Temp 98.4 F (36.9 C) (Temporal)   Resp 16   Ht 5' 0.25" (1.53 m)   Wt 195 lb 6.4 oz (88.6 kg)   SpO2 98%   BMI 37.85 kg/m    Physical Exam Vitals reviewed.  Constitutional:      Appearance: Normal appearance.  HENT:     Head: Normocephalic and atraumatic.     Right Ear: Tympanic membrane normal.     Left Ear: Tympanic membrane normal.     Nose:  Comments: Bilateral naris edematous and pale with thin clear nasal drainage noted.  Pharynx normal.  Ears normal.  Eyes normal.    Mouth/Throat:     Pharynx: Oropharynx is clear.  Eyes:     Conjunctiva/sclera: Conjunctivae normal.  Cardiovascular:     Rate and Rhythm: Normal rate and regular rhythm.     Heart sounds: Normal heart sounds. No murmur heard. Pulmonary:     Effort: Pulmonary effort is normal.     Breath sounds: Normal breath sounds.     Comments: Lungs  clear to auscultation Musculoskeletal:        General: Normal range of motion.     Cervical back: Normal range of motion and neck supple.  Skin:    General: Skin is warm.     Comments: Scattered papular urticaria noted on bilateral hands and arms.  No open areas or drainage noted.  Neurological:     Mental Status: She is alert and oriented to person, place, and time.  Psychiatric:        Mood and Affect: Mood normal.        Behavior: Behavior normal.        Thought Content: Thought content normal.        Judgment: Judgment normal.     Diagnostics: FVC 3.05 which is 100% of predicted value, FEV1 2.73 which is 102 % of predicted value. Spirometry indicates normal ventilatory function.  Assessment and Plan: 1. Idiopathic urticaria   2. Mild intermittent asthma without complication   3. Seasonal and perennial allergic rhinitis   4. Allergic conjunctivitis of both eyes   5. Gastroesophageal reflux disease, unspecified whether esophagitis present     Meds ordered this encounter  Medications   beclomethasone (QVAR REDIHALER) 40 MCG/ACT inhaler    Sig: Inhale 2 puffs into the lungs 2 (two) times daily.    Dispense:  10.6 each    Refill:  3    Patient Instructions  Hives (urticaria) Take the least amount of medications while remaining hive free Cetirizine (Zyrtec) 10mg  twice a day and famotidine (Pepcid) 20 mg twice a day. If no symptoms for 7-14 days then decrease to. Cetirizine (Zyrtec) 10mg  twice a day and famotidine (Pepcid) 20 mg once a day.  If no symptoms for 7-14 days then decrease to. Cetirizine (Zyrtec) 10mg  twice a day.  If no symptoms for 7-14 days then decrease to. Cetirizine (Zyrtec) 10mg  once a day.  May use Benadryl (diphenhydramine) as needed for breakthrough hives       If symptoms return, then step up dosage Keep a detailed symptom journal including foods eaten, contact with allergens, medications taken, weather changes.  Some lab work has been ordered to help  [place] evaluate your hives.  We will call you when the results become available.  Asthma  Begin Qvar 40-2 puffs twice a day to prevent cough or wheeze.  This will replace Flovent 110 as it is no longer covered under your insurance plan.  Do not use a spacer with Qvar Continue albuterol 2 puffs once every 4 hours as needed for cough or wheeze You may use albuterol 2 puffs 5 to 15 minutes before activity to decrease cough or wheeze  Allergic rhinitis Continue allergen avoidance measures directed toward grass pollen, tree pollen, mold, cat, and dust mite as listed below Continue cetirizine 10 mg once a day if needed for runny nose or itch Consider Flonase 2 sprays in each nostril once a day as needed for stuffy nose Consider  saline nasal rinses as needed for nasal symptoms. Use this before any medicated nasal sprays for best result Consider allergen immunotherapy if your symptoms are not well-controlled with the treatment plan as listed above. Written information provided  Allergic conjunctivitis Some over the counter eye drops include Pataday one drop in each eye once a day as needed for red, itchy eyes OR Zaditor one drop in each eye twice a day as needed for red itchy eyes. Avoid eye drops that say red eye relief as they may contain medications that dry out your eyes.   Reflux Continue dietary and lifestyle modifications as listed below  Call the clinic if this treatment plan is not working well for you.  Follow up in 2 months or sooner if needed.   Return in about 2 months (around 02/25/2023), or if symptoms worsen or fail to improve.    Thank you for the opportunity to care for this patient.  Please do not hesitate to contact me with questions.  Thermon Leyland, FNP [facility]

## 2022-12-26 NOTE — Patient Instructions (Addendum)
Hives (urticaria) Not well-controlled Take the least amount of medications while remaining hive free Cetirizine (Zyrtec) 10mg  twice a day and famotidine (Pepcid) 20 mg twice a day. If no symptoms for 7-14 days then decrease to. Cetirizine (Zyrtec) 10mg  twice a day and famotidine (Pepcid) 20 mg once a day.  If no symptoms for 7-14 days then decrease to. Cetirizine (Zyrtec) 10mg  twice a day.  If no symptoms for 7-14 days then decrease to. Cetirizine (Zyrtec) 10mg  once a day.  May use Benadryl (diphenhydramine) as needed for breakthrough hives       If symptoms return, then step up dosage Keep a detailed symptom journal including foods eaten, contact with allergens, medications taken, weather changes.  Some lab work has been ordered to help [place] evaluate your hives.  We will call you when the results become available.  Asthma Moderately well-controlled Begin Qvar 40-2 puffs twice a day to prevent cough or wheeze.  This will replace Flovent 110 as it is no longer covered under your insurance plan.  Do not use a spacer with Qvar Continue albuterol 2 puffs once every 4 hours as needed for cough or wheeze You may use albuterol 2 puffs 5 to 15 minutes before activity to decrease cough or wheeze  Allergic rhinitis Moderately well-controlled Continue allergen avoidance measures directed toward grass pollen, tree pollen, mold, cat, and dust mite as listed below Continue cetirizine 10 mg once a day if needed for runny nose or itch Consider Flonase 2 sprays in each nostril once a day as needed for stuffy nose Consider saline nasal rinses as needed for nasal symptoms. Use this before any medicated nasal sprays for best result Consider allergen immunotherapy if your symptoms are not well-controlled with the treatment plan as listed above. Written information provided  Allergic conjunctivitis Well-controlled Some over the counter eye drops include Pataday one drop in each eye once a day as needed for red,  itchy eyes OR Zaditor one drop in each eye twice a day as needed for red itchy eyes. Avoid eye drops that say red eye relief as they may contain medications that dry out your eyes.   Reflux Stable Continue dietary and lifestyle modifications as listed below  Call the clinic if this treatment plan is not working well for you.  Follow up in 2 months or sooner if needed.  Reducing Pollen Exposure The American Academy of Allergy, Asthma and Immunology suggests the following steps to reduce your exposure to pollen during allergy seasons. Do not hang sheets or clothing out to dry; pollen may collect on these items. Do not mow lawns or spend time around freshly cut grass; mowing stirs up pollen. Keep windows closed at night.  Keep car windows closed while driving. Minimize morning activities outdoors, a time when pollen counts are usually at their highest. Stay indoors as much as possible when pollen counts or humidity is high and on windy days when pollen tends to remain in the air longer. Use air conditioning when possible.  Many air conditioners have filters that trap the pollen spores. Use a HEPA room air filter to remove pollen form the indoor air you breathe.  Control of Dog or Cat Allergen Avoidance is the best way to manage a dog or cat allergy. If you have a dog or cat and are allergic to dog or cats, consider removing the dog or cat from the home. If you have a dog or cat but don't want to find it a new home, or if  your family wants a pet even though someone in the household is allergic, here are some strategies that may help keep symptoms at bay:  Keep the pet out of your bedroom and restrict it to only a few rooms. Be advised that keeping the dog or cat in only one room will not limit the allergens to that room. Don't pet, hug or kiss the dog or cat; if you do, wash your hands with soap and water. High-efficiency particulate air (HEPA) cleaners run continuously in a bedroom or living  room can reduce allergen levels over time. Regular use of a high-efficiency vacuum cleaner or a central vacuum can reduce allergen levels. Giving your dog or cat a bath at least once a week can reduce airborne allergen.   Control of Dust Mite Allergen Dust mites play a major role in allergic asthma and rhinitis. They occur in environments with high humidity wherever human skin is found. Dust mites absorb humidity from the atmosphere (ie, they do not drink) and feed on organic matter (including shed human and animal skin). Dust mites are a microscopic type of insect that you cannot see with the naked eye. High levels of dust mites have been detected from mattresses, pillows, carpets, upholstered furniture, bed covers, clothes, soft toys and any woven material. The principal allergen of the dust mite is found in its feces. A gram of dust may contain 1,000 mites and 250,000 fecal particles. Mite antigen is easily measured in the air during house cleaning activities. Dust mites do not bite and do not cause harm to humans, other than by triggering allergies/asthma.  Ways to decrease your exposure to dust mites in your home:  1. Encase mattresses, box springs and pillows with a mite-impermeable barrier or cover  2. Wash sheets, blankets and drapes weekly in hot water (130 F) with detergent and dry them in a dryer on the hot setting.  3. Have the room cleaned frequently with a vacuum cleaner and a damp dust-mop. For carpeting or rugs, vacuuming with a vacuum cleaner equipped with a high-efficiency particulate air (HEPA) filter. The dust mite allergic individual should not be in a room which is being cleaned and should wait 1 hour after cleaning before going into the room.  4. Do not sleep on upholstered furniture (eg, couches).  5. If possible removing carpeting, upholstered furniture and drapery from the home is ideal. Horizontal blinds should be eliminated in the rooms where the person spends the most  time (bedroom, study, television room). Washable vinyl, roller-type shades are optimal.  6. Remove all non-washable stuffed toys from the bedroom. Wash stuffed toys weekly like sheets and blankets above.  7. Reduce indoor humidity to less than 50%. Inexpensive humidity monitors can be purchased at most hardware stores. Do not use a humidifier as can make the problem worse and are not recommended.

## 2023-01-04 LAB — COMPREHENSIVE METABOLIC PANEL
ALT: 48 [IU]/L — ABNORMAL HIGH (ref 0–32)
AST: 27 [IU]/L (ref 0–40)
Albumin: 4.5 g/dL (ref 4.0–5.0)
Alkaline Phosphatase: 80 [IU]/L (ref 44–121)
BUN/Creatinine Ratio: 14 (ref 9–23)
BUN: 8 mg/dL (ref 6–20)
Bilirubin Total: <0.2 mg/dL (ref 0.0–1.2)
CO2: 20 mmol/L (ref 20–29)
Calcium: 9.4 mg/dL (ref 8.7–10.2)
Chloride: 107 mmol/L — ABNORMAL HIGH (ref 96–106)
Creatinine, Ser: 0.59 mg/dL (ref 0.57–1.00)
Globulin, Total: 2.2 g/dL (ref 1.5–4.5)
Glucose: 100 mg/dL — ABNORMAL HIGH (ref 70–99)
Potassium: 4.5 mmol/L (ref 3.5–5.2)
Sodium: 142 mmol/L (ref 134–144)
Total Protein: 6.7 g/dL (ref 6.0–8.5)
eGFR: 128 mL/min/{1.73_m2} (ref >59)

## 2023-01-04 LAB — CBC WITH DIFFERENTIAL/PLATELET
Basophils Absolute: 0.1 10*3/uL (ref 0.0–0.2)
Basos: 1 %
EOS (ABSOLUTE): 0.2 10*3/uL (ref 0.0–0.4)
Eos: 2 %
Hematocrit: 41.4 % (ref 34.0–46.6)
Hemoglobin: 13.7 g/dL (ref 11.1–15.9)
Immature Grans (Abs): 0 10*3/uL (ref 0.0–0.1)
Immature Granulocytes: 0 %
Lymphocytes Absolute: 1.8 10*3/uL (ref 0.7–3.1)
Lymphs: 25 %
MCH: 27.8 pg (ref 26.6–33.0)
MCHC: 33.1 g/dL (ref 31.5–35.7)
MCV: 84 fL (ref 79–97)
Monocytes Absolute: 0.5 10*3/uL (ref 0.1–0.9)
Monocytes: 7 %
Neutrophils Absolute: 4.7 10*3/uL (ref 1.4–7.0)
Neutrophils: 65 %
Platelets: 329 10*3/uL (ref 150–450)
RBC: 4.92 x10E6/uL (ref 3.77–5.28)
RDW: 13.1 % (ref 11.7–15.4)
WBC: 7.2 10*3/uL (ref 3.4–10.8)

## 2023-01-04 LAB — ALPHA-GAL PANEL
Allergen Lamb IgE: <0.1 kU/L
Beef IgE: <0.1 kU/L
IgE (Immunoglobulin E), Serum: 68 [IU]/mL (ref 6–495)
O215-IgE Alpha-Gal: <0.1 kU/L
Pork IgE: <0.1 kU/L

## 2023-01-04 LAB — THYROGLOBULIN ANTIBODY: Thyroglobulin Antibody: <1 [IU]/mL (ref 0.0–0.9)

## 2023-01-04 LAB — CHRONIC URTICARIA: cu index: 3.1 (ref <10)

## 2023-01-04 LAB — THYROID PEROXIDASE ANTIBODY: Thyroperoxidase Ab SerPl-aCnc: 11 [IU]/mL (ref 0–34)

## 2023-01-04 LAB — C4 COMPLEMENT: Complement C4, Serum: 30 mg/dL (ref 12–38)

## 2023-01-04 LAB — TRYPTASE: Tryptase: 5.1 ug/L (ref 2.2–13.2)

## 2023-01-09 NOTE — Progress Notes (Signed)
Please forward these labs to her PCP. Thank you

## 2023-01-09 NOTE — Progress Notes (Signed)
Can you please let this patient know that her lab results are in and overall look negative. Can you please let her know chronic urticaria panel was within normal limits-this looks for auto-immune source of hives, Tryptase was within normal limits-this checks mast cell activity, thyroid antibodies were within normal limits, alpha gal was negative-this looks for mammalian meat allergy- no need to avoid mammalian meats, and complement was within normal limits. Complement as a protein made by your liver which helps your immune system to work more efficiently. Please let her know that her ALT was elevated and to contact her PCP for further evaluation. Please ask her if the hives are resolving or if she is interested in Xolair for hive control. We can talk about Xolair at the follow up visit. Thank you

## 2023-01-10 ENCOUNTER — Telehealth: Payer: Self-pay | Admitting: Family Medicine

## 2023-01-10 NOTE — Telephone Encounter (Signed)
Patient called to return a call made to her about her lab results.

## 2023-01-10 NOTE — Telephone Encounter (Signed)
Pt informed of lab results. 

## 2023-11-21 ENCOUNTER — Ambulatory Visit

## 2023-11-21 ENCOUNTER — Other Ambulatory Visit (HOSPITAL_COMMUNITY)
Admission: RE | Admit: 2023-11-21 | Discharge: 2023-11-21 | Disposition: A | Discharge status: Home / Self Care | Source: Ambulatory Visit | Attending: Obstetrics and Gynecology | Admitting: Obstetrics and Gynecology

## 2023-11-21 VITALS — BP 132/88 | HR 87 | Ht 60.0 in | Wt 202.0 lb

## 2023-11-21 DIAGNOSIS — R300 Dysuria: Secondary | ICD-10-CM | POA: Diagnosis not present

## 2023-11-21 DIAGNOSIS — N898 Other specified noninflammatory disorders of vagina: Secondary | ICD-10-CM | POA: Diagnosis present

## 2023-11-21 LAB — POCT URINALYSIS DIPSTICK
Bilirubin, UA: NEGATIVE
Blood, UA: NEGATIVE
Glucose, UA: NEGATIVE
Ketones, UA: NEGATIVE
Nitrite, UA: NEGATIVE
Protein, UA: POSITIVE — AB
Spec Grav, UA: 1.02 (ref 1.010–1.025)
Urobilinogen, UA: 0.2 U/dL
pH, UA: 6 (ref 5.0–8.0)

## 2023-11-21 MED ORDER — NITROFURANTOIN MONOHYD MACRO 100 MG PO CAPS: 100.0000 mg | ORAL_CAPSULE | Freq: Two times a day (BID) | ORAL | 0 refills | Status: AC

## 2023-11-21 NOTE — Progress Notes (Signed)
 SUBJECTIVE:  28 y.o. female complains of [place] and creamy vaginal discharge for 3 day(s) and dysuria Denies abnormal vaginal bleeding or significant pelvic pain or fever.Denies history of known exposure to STD.  Patient's last menstrual period was 11/11/2023 (exact date).  OBJECTIVE:  She appears alert, well appearing, in no apparent distress Urine dipstick: positive for protein and positive for leukocytes.  ASSESSMENT:  Vaginal Discharge  Dysuria    PLAN:  GC, chlamydia, trichomonas, BVAG, CVAG probe and urine culture sent to lab. Macrobid  sent ot pharmacy.  Treatment: To be determined once lab results are received. ROV prn if symptoms persist or worsen.

## 2023-11-22 LAB — HIV ANTIBODY (ROUTINE TESTING W REFLEX): HIV Screen 4th Generation wRfx: NONREACTIVE

## 2023-11-22 LAB — CERVICOVAGINAL ANCILLARY ONLY
Bacterial Vaginitis (gardnerella): POSITIVE — AB
Chlamydia: NEGATIVE
Comment: NEGATIVE
Comment: NEGATIVE
Comment: NEGATIVE
Comment: NORMAL
Neisseria Gonorrhea: NEGATIVE
Trichomonas: NEGATIVE

## 2023-11-22 LAB — HEPATITIS C ANTIBODY: Hep C Virus Ab: NONREACTIVE

## 2023-11-22 LAB — RPR: RPR Ser Ql: NONREACTIVE

## 2023-11-22 LAB — HEPATITIS B SURFACE ANTIGEN: Hepatitis B Surface Ag: NEGATIVE

## 2023-11-23 ENCOUNTER — Other Ambulatory Visit: Payer: Self-pay

## 2023-11-23 DIAGNOSIS — B9689 Other specified bacterial agents as the cause of diseases classified elsewhere: Secondary | ICD-10-CM

## 2023-11-23 LAB — URINE CULTURE

## 2023-11-23 MED ORDER — METRONIDAZOLE 500 MG PO TABS: 500.0000 mg | ORAL_TABLET | Freq: Two times a day (BID) | ORAL | 0 refills | Status: AC

## 2023-11-23 NOTE — Progress Notes (Signed)
 Prescription for flagyl  sent for treatment of BV.

## 2023-12-13 ENCOUNTER — Ambulatory Visit: Admitting: Physician Assistant

## 2023-12-13 ENCOUNTER — Other Ambulatory Visit (HOSPITAL_COMMUNITY)
Admission: RE | Admit: 2023-12-13 | Discharge: 2023-12-13 | Disposition: A | Discharge status: Home / Self Care | Source: Ambulatory Visit | Attending: Physician Assistant | Admitting: Physician Assistant

## 2023-12-13 ENCOUNTER — Encounter: Payer: Self-pay | Admitting: Physician Assistant

## 2023-12-13 VITALS — BP 125/81 | HR 75 | Ht 60.0 in | Wt 201.0 lb

## 2023-12-13 DIAGNOSIS — Z124 Encounter for screening for malignant neoplasm of cervix: Secondary | ICD-10-CM

## 2023-12-13 DIAGNOSIS — Z1331 Encounter for screening for depression: Secondary | ICD-10-CM | POA: Diagnosis not present

## 2023-12-13 DIAGNOSIS — N939 Abnormal uterine and vaginal bleeding, unspecified: Secondary | ICD-10-CM

## 2023-12-13 DIAGNOSIS — Z01419 Encounter for gynecological examination (general) (routine) without abnormal findings: Secondary | ICD-10-CM

## 2023-12-13 NOTE — Progress Notes (Signed)
 ANNUAL EXAM Patient name: Diamond Farley MRN [id number]  Date of birth: 1995/09/30 Chief Complaint:   Gynecologic Exam  History of Present Illness:   Diamond Farley is a 28 y.o. G0P0000 female being seen today for a routine annual exam.   Current complaints: Irregularly irregular periods that occurs every 20-40 days. Cannot recall when this started. Menses lasts 3-5 days, varied volume of bleeding ranging from heavy to light.    +Cramping  -Headaches, pelvic pain, dyspareunia, dyschezia, dysuria, urinary frequency,  constipation   Patient's last menstrual period was 12/11/2023.  The pregnancy intention screening data noted above was reviewed. Potential methods of contraception were discussed. The patient elected to proceed with No data recorded.   Last pap 07/22/22 NILM, HPV negative. H/O abnormal pap: no Last mammogram: never done due to age. Results were: N/A. Family h/o breast cancer: no Last colonoscopy: never done due to age. Results were: N/A. Family h/o colorectal cancer: no STI screening: none-not currently sexually active  Contraception: none     12/13/2023    9:24 AM 07/22/2022    9:22 AM  Depression screen PHQ 2/9  Decreased Interest 1 1  Down, Depressed, Hopeless 0 1  PHQ - 2 Score 1 2  Altered sleeping 2 2  Tired, decreased energy 2 2  Change in appetite 2 2  Feeling bad or failure about yourself  0 1  Trouble concentrating 1 2  Moving slowly or fidgety/restless 2 3  Suicidal thoughts 0 1  PHQ-9 Score 10 15        12/13/2023    9:25 AM 07/22/2022    9:22 AM  GAD 7 : Generalized Anxiety Score  Nervous, Anxious, on Edge 1 2  Control/stop worrying 0 2  Worry too much - different things 0 1  Trouble relaxing 1 1  Restless 0 2  Easily annoyed or irritable 3 3  Afraid - awful might happen 0 3  Total GAD 7 Score 5 14    Review of Systems:   Pertinent items are noted in HPI Denies any headaches, blurred vision, fatigue, shortness of breath, chest  pain, abdominal pain, abnormal vaginal discharge/itching/odor/irritation, problems with periods, bowel movements, urination, or intercourse unless otherwise stated above. Pertinent History Reviewed:  Reviewed past medical,surgical, social and family history.  Reviewed problem list, medications and allergies. Physical Assessment:   Vitals:   12/13/23 0825  BP: 125/81  Pulse: 75  Weight: 201 lb (91.2 kg)  Height: 5' (1.524 m)  Body mass index is 39.26 kg/m.        Physical Examination:   General appearance - well appearing, and in no distress  Mental status - alert, oriented to person, place, and time  Psych:  She has a normal mood and affect  Skin - warm and dry, normal color, no suspicious lesions noted  Chest - effort normal, all lung fields clear to auscultation bilaterally  Heart - normal rate and regular rhythm  Neck:  +Acanthosis nigricans, excessive hair on underside of chin. Midline trachea, no thyromegaly or nodules  Breasts - breasts appear normal, no suspicious masses, no skin or nipple changes or  axillary nodes  Abdomen - soft, nontender, nondistended, no masses or organomegaly  Pelvic - VULVA: normal appearing vulva with no masses, tenderness or lesions  VAGINA: normal appearing vagina with normal color and discharge, no lesions  CERVIX: normal appearing cervix without discharge or lesions, no CMT  UTERUS: uterus is felt to be normal size, shape, consistency and nontender  ADNEXA: No adnexal masses or tenderness noted.  Extremities:  No swelling or varicosities noted  Chaperone present for exam  Assessment & Plan:  1. Encounter for annual routine gynecological examination (Primary) 2. Cervical cancer screening - Cervical cancer screening: Discussed screening Q3 years. Reviewed importance of annual exams and limits of pap smear. Pap with reflex HPV due 2029 - GC/CT: Discussed and recommended. Pt  accepts - Birth Control: none - Breast Health: Encouraged self breast  awareness/exams. Teaching provided. -Mammogram: @ 26yo, or sooner if problems -Colonoscopy: @ 26yo, or sooner if problems - Follow-up: 12 months and prn   3. Abnormal uterine bleeding Patient with both polymenorrhea and oligomenorrhea. Suspect PCOS given signs of hyperandrogenism and insulin resistance on exam. Work-up will include TVUS and AUB labs to rule out structural abnormalities and other endocrine pathology. Will follow results. Patient states understanding and agreement with aforementioned plan.   - TSH Rfx on Abnormal to Free T4 - CBC - Prolactin - Cervicovaginal ancillary only - US  PELVIC COMPLETE WITH TRANSVAGINAL; Future - HgB A1c   4. Positive depression screening No SI/HI. Open to speaking to [facility] counselor.    Orders Placed This Encounter  Procedures   US  PELVIC COMPLETE WITH TRANSVAGINAL   TSH Rfx on Abnormal to Free T4   CBC   Prolactin   HgB A1c    Meds: No orders of the defined types were placed in this encounter.  Follow-up: No follow-ups on file.  Diamond Farley, [place] 12/13/2023 6:44 PM

## 2023-12-13 NOTE — Progress Notes (Signed)
 Denies concerns today. Currently on menses but flow light. Periods irregular now. Sometimes skipping months. PHQ 10 GAD 5

## 2023-12-14 LAB — CBC
Hematocrit: 42.4 % (ref 34.0–46.6)
Hemoglobin: 14.1 g/dL (ref 11.1–15.9)
MCH: 28.5 pg (ref 26.6–33.0)
MCHC: 33.3 g/dL (ref 31.5–35.7)
MCV: 86 fL (ref 79–97)
Platelets: 204 x10E3/uL (ref 150–450)
RBC: 4.95 x10E6/uL (ref 3.77–5.28)
RDW: 14 % (ref 11.7–15.4)
WBC: 6.8 x10E3/uL (ref 3.4–10.8)

## 2023-12-14 LAB — CERVICOVAGINAL ANCILLARY ONLY
Chlamydia: NEGATIVE
Comment: NEGATIVE
Comment: NEGATIVE
Comment: NORMAL
Neisseria Gonorrhea: NEGATIVE
Trichomonas: NEGATIVE

## 2023-12-14 LAB — HEMOGLOBIN A1C
Est. average glucose Bld gHb Est-mCnc: 114 mg/dL
Hgb A1c MFr Bld: 5.6 % (ref 4.8–5.6)

## 2023-12-14 LAB — PROLACTIN: Prolactin: 19.9 ng/mL (ref 4.8–33.4)

## 2023-12-14 LAB — TSH RFX ON ABNORMAL TO FREE T4: TSH: 1.81 u[IU]/mL (ref 0.450–4.500)

## 2023-12-16 ENCOUNTER — Ambulatory Visit: Payer: Self-pay | Admitting: Physician Assistant

## 2023-12-20 ENCOUNTER — Ambulatory Visit (HOSPITAL_BASED_OUTPATIENT_CLINIC_OR_DEPARTMENT_OTHER)
Admission: RE | Admit: 2023-12-20 | Discharge: 2023-12-20 | Disposition: A | Discharge status: Home / Self Care | Source: Ambulatory Visit | Attending: Physician Assistant | Admitting: Physician Assistant

## 2023-12-20 DIAGNOSIS — N939 Abnormal uterine and vaginal bleeding, unspecified: Secondary | ICD-10-CM | POA: Diagnosis present

## 2023-12-27 ENCOUNTER — Telehealth: Payer: Self-pay

## 2023-12-27 NOTE — Telephone Encounter (Signed)
 routed

## 2023-12-29 ENCOUNTER — Other Ambulatory Visit: Payer: Self-pay | Admitting: Physician Assistant

## 2023-12-29 DIAGNOSIS — E282 Polycystic ovarian syndrome: Secondary | ICD-10-CM

## 2023-12-29 DIAGNOSIS — N83209 Unspecified ovarian cyst, unspecified side: Secondary | ICD-10-CM

## 2023-12-29 MED ORDER — DROSPIRENONE-ETHINYL ESTRADIOL 3-0.02 MG PO TABS: 1.0000 | ORAL_TABLET | Freq: Every day | ORAL | 3 refills | Status: AC

## 2024-03-25 ENCOUNTER — Ambulatory Visit (HOSPITAL_BASED_OUTPATIENT_CLINIC_OR_DEPARTMENT_OTHER)

## 2024-04-02 ENCOUNTER — Other Ambulatory Visit (HOSPITAL_COMMUNITY)
Admission: RE | Admit: 2024-04-02 | Discharge: 2024-04-02 | Disposition: A | Source: Ambulatory Visit | Attending: Obstetrics and Gynecology | Admitting: Obstetrics and Gynecology

## 2024-04-02 ENCOUNTER — Ambulatory Visit: Payer: Self-pay

## 2024-04-02 VITALS — BP 119/78 | HR 64 | Wt 192.4 lb

## 2024-04-02 DIAGNOSIS — N898 Other specified noninflammatory disorders of vagina: Secondary | ICD-10-CM

## 2024-04-02 DIAGNOSIS — R300 Dysuria: Secondary | ICD-10-CM

## 2024-04-02 LAB — POCT URINALYSIS DIPSTICK
Bilirubin, UA: NEGATIVE
Blood, UA: NEGATIVE
Glucose, UA: NEGATIVE
Nitrite, UA: NEGATIVE
Odor: NEGATIVE
Protein, UA: POSITIVE — AB
Spec Grav, UA: 1.01
Urobilinogen, UA: 0.2 U/dL
pH, UA: 6

## 2024-04-02 NOTE — Progress Notes (Signed)
..  SUBJECTIVE:  29 y.o. female complains of watery vaginal discharge/odor, irritated vulva, and dysuria for 1.5 week(s). Denies abnormal vaginal bleeding or significant pelvic pain or fever. Denies history of known exposure to STD.  Patient's last menstrual period was 02/20/2024. Pt is taking BC pills, reports hx of irregular cycles due to PCOS  OBJECTIVE:  She appears well, afebrile. Urine dipstick: positive for protein, positive for leukocytes, and positive for ketones.  ASSESSMENT:  Vaginal Discharge  Vaginal Odor Dysuria   PLAN:  GC, chlamydia, trichomonas, BVAG, CVAG probe sent to lab. Treatment: To be determined once lab results are received ROV prn if symptoms persist or worsen.

## 2024-04-03 ENCOUNTER — Ambulatory Visit: Payer: Self-pay | Admitting: Obstetrics and Gynecology

## 2024-04-03 LAB — CERVICOVAGINAL ANCILLARY ONLY
Bacterial Vaginitis (gardnerella): NEGATIVE
Candida Glabrata: NEGATIVE
Candida Vaginitis: NEGATIVE
Chlamydia: NEGATIVE
Comment: NEGATIVE
Comment: NEGATIVE
Comment: NEGATIVE
Comment: NEGATIVE
Comment: NEGATIVE
Comment: NORMAL
Neisseria Gonorrhea: POSITIVE — AB
Trichomonas: NEGATIVE

## 2024-04-04 NOTE — Progress Notes (Signed)
 TC. No answer. VM not set up. MyChart message including office phone number sent. Pt education sent.

## 2024-04-05 ENCOUNTER — Ambulatory Visit

## 2024-04-05 VITALS — BP 130/86 | HR 56 | Wt 193.0 lb

## 2024-04-05 DIAGNOSIS — A549 Gonococcal infection, unspecified: Secondary | ICD-10-CM

## 2024-04-05 MED ORDER — CEFTRIAXONE SODIUM 500 MG IJ SOLR
500.0000 mg | Freq: Once | INTRAMUSCULAR | Status: AC
  Administered 2024-04-05: 500 mg via INTRAMUSCULAR

## 2024-04-05 NOTE — Progress Notes (Signed)
 Pt is in the office for Rocephin  injection for +GC Administered in LUOQ and pt tolerated well. .. Administrations This Visit     cefTRIAXone  (ROCEPHIN ) injection 500 mg     Admin Date 04/05/2024 Action Given Dose 500 mg Route Intramuscular Documented By Doneta Laymon BIRCH, RN

## 2024-04-11 ENCOUNTER — Ambulatory Visit: Payer: Self-pay

## 2024-05-03 ENCOUNTER — Ambulatory Visit: Payer: Self-pay
